# Patient Record
Sex: Male | Born: 1990 | Race: Black or African American | Hispanic: No | Marital: Married | Smoking: Current some day smoker
Health system: Southern US, Community
[De-identification: ages and names within clinical notes are randomized; demographics above are authoritative.]

## PROBLEM LIST (undated history)

## (undated) HISTORY — PX: TONSILLECTOMY: SUR1361

---

## 2016-05-28 ENCOUNTER — Emergency Department (HOSPITAL_COMMUNITY)
Admission: EM | Admit: 2016-05-28 | Discharge: 2016-05-28 | Disposition: A | Discharge status: Home / Self Care | Payer: Self-pay | Attending: Emergency Medicine | Admitting: Emergency Medicine

## 2016-05-28 ENCOUNTER — Encounter (HOSPITAL_COMMUNITY): Payer: Self-pay | Admitting: Emergency Medicine

## 2016-05-28 DIAGNOSIS — N5089 Other specified disorders of the male genital organs: Secondary | ICD-10-CM | POA: Insufficient documentation

## 2016-05-28 DIAGNOSIS — F17200 Nicotine dependence, unspecified, uncomplicated: Secondary | ICD-10-CM | POA: Insufficient documentation

## 2016-05-28 NOTE — ED Notes (Signed)
Bed: WTR5 Expected date:  Expected time:  Means of arrival:  Comments: 

## 2016-05-28 NOTE — Discharge Instructions (Signed)
You will be called if your results are abnormal.  Follow up with a urologist if your symptoms continue.

## 2016-05-28 NOTE — ED Provider Notes (Signed)
[facility]-EMERGENCY DEPT Provider Note   CSN: [id number] Arrival date & time: 05/28/16  1455     History   Chief Complaint Chief Complaint  Patient presents with  . Blisters    HPI Chad Paul is a 26 y.o. male.  Patient is a 26 year old male with no significant past medical history who presents with a painless lesion to his penis. He states he noticed it about 2 days ago and it has been constant since that time. It's not draining. He denies any penile discharge. No discomfort on urination. No abdominal pain. No nausea or vomiting. No history of STDs.      History reviewed. No pertinent past medical history.  There are no active problems to display for this patient.   Past Surgical History:  Procedure Laterality Date  . TONSILLECTOMY         Home Medications    Prior to Admission medications   Medication Sig Start Date End Date Taking? Authorizing Provider  guaiFENesin (MUCINEX) 600 MG 12 hr tablet Take 600 mg by mouth 2 (two) times daily.   Yes [provider]    Family History No family history on file.  Social History Social History  Substance Use Topics  . Smoking status: Current Some Day Smoker  . Smokeless tobacco: Not on file  . Alcohol use Yes     Comment: social     Allergies   Peanut-containing drug products   Review of Systems Review of Systems  Constitutional: Negative for chills, diaphoresis, fatigue and fever.  HENT: Negative for congestion, rhinorrhea and sneezing.   Eyes: Negative.   Respiratory: Negative for cough, chest tightness and shortness of breath.   Cardiovascular: Negative for chest pain and leg swelling.  Gastrointestinal: Negative for abdominal pain, blood in stool, diarrhea, nausea and vomiting.  Genitourinary: Positive for genital sores. Negative for difficulty urinating, flank pain, frequency and hematuria.  Musculoskeletal: Negative for arthralgias and back pain.  Skin: Negative for rash.  Neurological:  Negative for dizziness, speech difficulty, weakness, numbness and headaches.     Physical Exam Updated Vital Signs BP 121/73 (BP Location: Right Arm)   Pulse 67   Temp 98.1 F (36.7 C) (Oral)   Resp 16   SpO2 95%   Physical Exam  Constitutional: He is oriented to person, place, and time. He appears well-developed and well-nourished.  HENT:  Head: Normocephalic and atraumatic.  Eyes: Pupils are equal, round, and reactive to light.  Neck: Normal range of motion. Neck supple.  Cardiovascular: Normal rate and regular rhythm.   Pulmonary/Chest: Effort normal. No respiratory distress. He has no wheezes. He has no rales. He exhibits no tenderness.  Abdominal: Soft. Bowel sounds are normal. There is no tenderness. There is no rebound and no guarding.  Genitourinary:  Genitourinary Comments: Normal circumcised male genitalia. There is one small papule with a small area of ulceration at the tip of the papule to the shaft of the penis. There is no surrounding erythema. No drainage. No other lesions are noted. No penile discharge is noted. No lymphadenopathy is noted.  Musculoskeletal: Normal range of motion. He exhibits no edema.  Lymphadenopathy:    He has no cervical adenopathy.  Neurological: He is alert and oriented to person, place, and time.  Skin: Skin is warm and dry. No rash noted.  Psychiatric: He has a normal mood and affect.     ED Treatments / Results  Labs (all labs ordered are listed, but only abnormal results are displayed) Labs  Reviewed  RPR  HIV ANTIBODY (ROUTINE TESTING)  HSV 1 ANTIBODY, IGG  HSV 2 ANTIBODY, IGG  GC/CHLAMYDIA PROBE AMP ([place]    EKG  EKG Interpretation None       Radiology No results found.  Procedures Procedures (including critical care time)  Medications Ordered in ED Medications - No data to display   Initial Impression / Assessment and Plan / ED Course  I have reviewed the triage vital signs and the nursing  notes.  Pertinent labs & imaging results that were available during my care of the patient were reviewed by me and considered in my medical decision making (see chart for details).    Patient presents with a small painless ulcerated lesion to his penis. Possibly could be syphilis although does not look classic. Given its painless characteristic, I would doubt that it's a herpetic lesion or cane coronary. It doesn't appear to be a genital wart. I will go ahead and testing for STDs including HIV, syphilis and herpes. Will refer him to urology if his labs are negative and his lesion continues.  Final Clinical Impressions(s) / ED Diagnoses   Final diagnoses:  Genital lesion, male    New Prescriptions New Prescriptions   No medications on file     Rolan BuccoBelfi, Alto Gandolfo, MD 05/28/16 1615

## 2016-05-28 NOTE — ED Triage Notes (Signed)
Pt complaint of redness and blisters on penis noted yesterday.

## 2016-05-29 LAB — HSV 1 ANTIBODY, IGG

## 2016-05-29 LAB — HSV 2 ANTIBODY, IGG: HSV 2 Glycoprotein G Ab, IgG: <0.91 index (ref 0.00–0.90)

## 2016-05-29 LAB — RPR: RPR Ser Ql: NONREACTIVE

## 2016-05-29 LAB — HIV ANTIBODY (ROUTINE TESTING W REFLEX): HIV Screen 4th Generation wRfx: NONREACTIVE

## 2016-05-30 LAB — GC/CHLAMYDIA PROBE AMP (~~LOC~~) NOT AT ARMC
CHLAMYDIA, DNA PROBE: NEGATIVE
NEISSERIA GONORRHEA: NEGATIVE

## 2016-06-02 ENCOUNTER — Encounter (HOSPITAL_COMMUNITY): Payer: Self-pay | Admitting: Emergency Medicine

## 2016-06-02 ENCOUNTER — Emergency Department (HOSPITAL_COMMUNITY)
Admission: EM | Admit: 2016-06-02 | Discharge: 2016-06-02 | Disposition: A | Discharge status: Home / Self Care | Payer: Self-pay | Attending: Emergency Medicine | Admitting: Emergency Medicine

## 2016-06-02 DIAGNOSIS — Z91010 Allergy to peanuts: Secondary | ICD-10-CM | POA: Insufficient documentation

## 2016-06-02 DIAGNOSIS — F17200 Nicotine dependence, unspecified, uncomplicated: Secondary | ICD-10-CM | POA: Insufficient documentation

## 2016-06-02 DIAGNOSIS — Z79899 Other long term (current) drug therapy: Secondary | ICD-10-CM | POA: Insufficient documentation

## 2016-06-02 DIAGNOSIS — L989 Disorder of the skin and subcutaneous tissue, unspecified: Secondary | ICD-10-CM

## 2016-06-02 DIAGNOSIS — K120 Recurrent oral aphthae: Secondary | ICD-10-CM | POA: Insufficient documentation

## 2016-06-02 DIAGNOSIS — Z720 Tobacco use: Secondary | ICD-10-CM

## 2016-06-02 MED ORDER — MAGIC MOUTHWASH W/LIDOCAINE: 15.0000 mL | Freq: Three times a day (TID) | ORAL | 0 refills | Status: DC | PRN

## 2016-06-02 MED ORDER — MAGIC MOUTHWASH W/LIDOCAINE
15.0000 mL | Freq: Once | ORAL | Status: AC
  Administered 2016-06-02: 15 mL via ORAL
  Filled 2016-06-02: qty 15

## 2016-06-02 NOTE — ED Triage Notes (Signed)
Patient here with complaints of oral lesions. Reports having the same lesion on genitals 1 week ago but tested negative. Pain 10/10. Unable to eat.

## 2016-06-02 NOTE — Discharge Instructions (Signed)
Use magic mouthwash as directed (swish and swallow) to help with oral lesions and pain. STOP SMOKING! Try to minimize stress. Stick to a bland diet and avoid spicy or acidic foods. Alternate between tylenol and motrin as needed for additional pain relief. Follow up with the [facility] in 1-2 weeks for recheck of symptoms and to establish medical care, and they will assess whether they need to send you to a specialist. Return to the ER for emergent changes or worsening symptoms.

## 2016-06-02 NOTE — ED Provider Notes (Signed)
[facility]-EMERGENCY DEPT Provider Note    By signing my name below, I, Earmon Phoenix, attest that this documentation has been prepared under the direction and in the presence of [address], [employer]. Electronically Signed: Earmon Phoenix, ED Scribe. 06/02/16. 1:23 PM.    History   Chief Complaint Chief Complaint  Patient presents with  . Mouth Lesions   The history is provided by the patient and medical records. No language interpreter was used.  Mouth Lesions  Location:  Buccal mucosa and tongue Quality:  Painful and ulcerous Pain details:    Quality:  Throbbing   Severity:  Moderate   Duration:  1 week   Timing:  Intermittent   Progression:  Worsening Onset quality:  Sudden Severity:  Moderate Duration:  1 week Progression:  Worsening Chronicity:  New Context: stress   Context: not a change in diet, not a change in medications, not medications, not a possible infection and not trauma   Relieved by:  Topical medications and topical solutions Worsened by:  Drinking and eating Associated symptoms: rash   Associated symptoms: no congestion, no ear pain, no fever, no rhinorrhea and no sore throat      Chad Paul is a 26 y.o. Otherwise healthy male who presents to the Emergency Department complaining of worsening, stinging and throbbing blisters/ulcers in the mouth that appeared about one week ago. He reports associated painless blisters to the penis, neck and chest that start out as blisters, then open up and scab over; denies drainage/redness/warmth/crusting. Pt was seen here five days ago for a similar lesion on the penis and was tested for GC/chlamydia, HSV 1 & 2, RPR, and HIV all with negative results. He states that since then, the lesions in his mouth have worsened and more have developed, so he came back for reassessment. He describes the mouth pain as 9/10 at worst, intermittent, stinging/throbbing nonradiating pain around the oral lesions, worse with  eating/drinking, and mildly improved with oragel and mouth wash. He also mentions that he has a mild nonproductive cough that began 1-2 days ago but didn't think it was anything related. He reports some oral swelling due to the lesions, which is intermittent and only seeming to be when the pain occurs. He reports an excessive amount of stress lately. His girlfriend denies any similar symptoms. He denies sick contacts with anyone else with similar symptoms. He denies any new foods, medications, lotions, creams, detergents or other personal hygeine products. He denies drainage from the lesions, fever, chills, drooling, trismus, difficulty swallowing, ear pain/drainage, rhinorrhea, SOB, CP, abdominal pain, constipation, diarrhea, nausea, vomiting, dysuria, hematuria, body aches, numbness, tingling, focal weakness, or any other complaints at this time. He is a smoker. He does not have a PCP.    History reviewed. No pertinent past medical history.  There are no active problems to display for this patient.   Past Surgical History:  Procedure Laterality Date  . TONSILLECTOMY         Home Medications    Prior to Admission medications   Medication Sig Start Date End Date Taking? Authorizing Provider  guaiFENesin (MUCINEX) 600 MG 12 hr tablet Take 600 mg by mouth 2 (two) times daily.    [provider]    Family History No family history on file.  Social History Social History  Substance Use Topics  . Smoking status: Current Some Day Smoker  . Smokeless tobacco: Never Used  . Alcohol use Yes     Comment: social  Allergies   Peanut-containing drug products   Review of Systems Review of Systems  Constitutional: Negative for chills and fever.  HENT: Positive for mouth sores. Negative for congestion, drooling, ear discharge, ear pain, rhinorrhea, sore throat and trouble swallowing.   Respiratory: Positive for cough (mild, dry). Negative for shortness of breath.     Cardiovascular: Negative for chest pain.  Gastrointestinal: Negative for abdominal pain, constipation, diarrhea, nausea and vomiting.  Genitourinary: Positive for genital sores. Negative for dysuria and hematuria.  Musculoskeletal: Negative for arthralgias and myalgias.  Skin: Positive for rash.  Allergic/Immunologic: Negative for immunocompromised state.  Neurological: Negative for weakness and numbness.  Psychiatric/Behavioral: Negative for confusion.   All other systems reviewed and are negative for acute change except as noted in the HPI.   Physical Exam Updated Vital Signs BP 114/86 (BP Location: Left Arm)   Pulse (!) 101   Temp 98.5 F (36.9 C) (Oral)   Resp 18   SpO2 99%   Physical Exam  Constitutional: He is oriented to person, place, and time. Vital signs are normal. He appears well-developed and well-nourished.  Non-toxic appearance. No distress.  Afebrile, nontoxic, NAD  HENT:  Head: Normocephalic and atraumatic.  Nose: Nose normal.  Mouth/Throat: Uvula is midline, oropharynx is clear and moist and mucous membranes are normal. Oral lesions present. No trismus in the jaw. No uvula swelling. Tonsils are 0 on the right. Tonsils are 0 on the left. No tonsillar exudate.  Small scabbed over blister to upper lip, no drainage or swelling, no crusting, no erythema/warmth. Multiple aphthous ulcerations in buccal mucosal surfaces and underneath tongue, with small halo of erythema with central clearing, no drainage/swelling, handling secretions well. No plaques.  Posterior oropharynx clear and moist, without uvular swelling or deviation, no trismus or drooling, no tonsillar swelling or erythema, no exudates.  Nose clear.   Eyes: Conjunctivae and EOM are normal. Right eye exhibits no discharge. Left eye exhibits no discharge.  Neck: Normal range of motion. Neck supple.  Cardiovascular: Normal rate and intact distal pulses.   Pulmonary/Chest: Effort normal. No respiratory distress.   Abdominal: Normal appearance. He exhibits no distension.  Musculoskeletal: Normal range of motion.  Neurological: He is alert and oriented to person, place, and time. He has normal strength. No sensory deficit.  Skin: Skin is warm, dry and intact. Lesion noted. No rash noted.  Small shallow/superficial ?ulceration to anterior L chest that looks like a scrape where the scab has come off, no raised border, no crusting/drainage, no swelling, no erythema/warmth. Nontender.   Psychiatric: He has a normal mood and affect.  Nursing note and vitals reviewed.    ED Treatments / Results  DIAGNOSTIC STUDIES: Oxygen Saturation is 99% on RA, normal by my interpretation.   COORDINATION OF CARE: 1:14 PM- Will treat symptomatically. Will give information for patient to establish care at [facility]. Pt verbalizes understanding and agrees to plan.  Medications  magic mouthwash w/lidocaine (not administered)    Labs (all labs ordered are listed, but only abnormal results are displayed) Labs Reviewed - No data to display   Results for orders placed or performed during the hospital encounter of 05/28/16  RPR  Result Value Ref Range   RPR Ser Ql Non Reactive Non Reactive  HIV antibody  Result Value Ref Range   HIV Screen 4th Generation wRfx Non Reactive Non Reactive  HSV 1 antibody, IgG  Result Value Ref Range   HSV 1 Glycoprotein G Ab, IgG <0.91 0.00 - 0.90 index  HSV 2 antibody, IgG  Result Value Ref Range   HSV 2 Glycoprotein G Ab, IgG <0.91 0.00 - 0.90 index  GC/Chlamydia probe amp  Result Value Ref Range   Chlamydia Negative    Neisseria gonorrhea Negative     EKG  EKG Interpretation None       Radiology No results found.  Procedures Procedures (including critical care time)  Medications Ordered in ED Medications  magic mouthwash w/lidocaine (not administered)     Initial Impression / Assessment and Plan / ED Course  I have reviewed the triage vital signs and the nursing  notes.  Pertinent labs & imaging results that were available during my care of the patient were reviewed by me and considered in my medical decision making (see chart for details).     26 y.o. male here with oral ulcerations and ongoing skin ulcerations. Was seen here 5 days ago for painless genital sore, was tested for HIV/RPR/GC/CT/HSV1+2 which were all negative. Now having worsening oral lesions. Admits to smoking cigarettes, cessation advised; admits to a lot of stress recently as well, no new foods/exposures, and no sick contacts. On exam, oral lesions appear to be aphthous ulcers, multiple of them on the buccal mucosal surfaces and underside of tongue, no swelling or drainage appreciated; handling secretions well. Small scabbed lesions to upper lip. Anterior chest with shallow ulceration without surrounding swelling or crusting, no erythema or warmth, looks like scrape that the scab came off of. Very odd symptoms, difficult etiology to narrow down; could be IBD vs viral illness vs stress related vs food related. Hard to say where he should go for further testing: derm vs ENT vs GI vs ID? No focal area of issue to guide our follow up. Since he's been tested for all the logical etiologies, and all were negative, will proceed with magic mouthwash, advised stress reduction, bland diet, avoidance of spicy/acidic foods, and OTC remedies for symptomatic relief, and f/up with [facility] in 1-2wks to establish care and recheck. Hopefully something will start to declare itself and help guide the referral, if needed. For now, will hold off on referral to any specialist. I explained the diagnosis and have given explicit precautions to return to the ER including for any other new or worsening symptoms. The patient understands and accepts the medical plan as it's been dictated and I have answered their questions. Discharge instructions concerning home care and prescriptions have been given. The patient is STABLE and is  discharged to home in good condition.   I personally performed the services described in this documentation, which was scribed in my presence. The recorded information has been reviewed and is accurate.   Final Clinical Impressions(s) / ED Diagnoses   Final diagnoses:  Aphthous ulcer of mouth  Skin lesions, generalized  Tobacco user    New Prescriptions New Prescriptions   MAGIC MOUTHWASH W/LIDOCAINE SOLN    Take 15 mLs by mouth 3 (three) times daily as needed for mouth pain. Dispense in a 1/1/1/1 ratio. Use lidocaine, diphenhydramine, alum & mag hydroxide-simeth, and nystatin     [address], [place], [place] 06/02/16 1339    Charlynne PanderYao, David Hsienta, MD 06/04/16 [phone]

## 2017-01-02 ENCOUNTER — Encounter (HOSPITAL_COMMUNITY): Payer: Self-pay | Admitting: Emergency Medicine

## 2017-01-02 ENCOUNTER — Other Ambulatory Visit: Payer: Self-pay

## 2017-01-02 ENCOUNTER — Emergency Department (HOSPITAL_COMMUNITY)
Admission: EM | Admit: 2017-01-02 | Discharge: 2017-01-02 | Disposition: A | Discharge status: Home / Self Care | Payer: Self-pay | Attending: Emergency Medicine | Admitting: Emergency Medicine

## 2017-01-02 ENCOUNTER — Emergency Department (HOSPITAL_COMMUNITY): Payer: Self-pay

## 2017-01-02 DIAGNOSIS — X503XXA Overexertion from repetitive movements, initial encounter: Secondary | ICD-10-CM | POA: Insufficient documentation

## 2017-01-02 DIAGNOSIS — Y990 Civilian activity done for income or pay: Secondary | ICD-10-CM | POA: Insufficient documentation

## 2017-01-02 DIAGNOSIS — M62838 Other muscle spasm: Secondary | ICD-10-CM | POA: Insufficient documentation

## 2017-01-02 DIAGNOSIS — S46912A Strain of unspecified muscle, fascia and tendon at shoulder and upper arm level, left arm, initial encounter: Secondary | ICD-10-CM | POA: Insufficient documentation

## 2017-01-02 DIAGNOSIS — Y9389 Activity, other specified: Secondary | ICD-10-CM | POA: Insufficient documentation

## 2017-01-02 DIAGNOSIS — Y9289 Other specified places as the place of occurrence of the external cause: Secondary | ICD-10-CM | POA: Insufficient documentation

## 2017-01-02 DIAGNOSIS — F17210 Nicotine dependence, cigarettes, uncomplicated: Secondary | ICD-10-CM | POA: Insufficient documentation

## 2017-01-02 MED ORDER — DIAZEPAM 5 MG PO TABS
5.0000 mg | ORAL_TABLET | Freq: Once | ORAL | Status: AC
  Administered 2017-01-02: 5 mg via ORAL
  Filled 2017-01-02: qty 1

## 2017-01-02 MED ORDER — CYCLOBENZAPRINE HCL 10 MG PO TABS: 10.0000 mg | ORAL_TABLET | Freq: Two times a day (BID) | ORAL | 0 refills | Status: DC | PRN

## 2017-01-02 MED ORDER — KETOROLAC TROMETHAMINE 60 MG/2ML IM SOLN
30.0000 mg | Freq: Once | INTRAMUSCULAR | Status: AC
  Administered 2017-01-02: 30 mg via INTRAMUSCULAR
  Filled 2017-01-02: qty 2

## 2017-01-02 MED ORDER — DICLOFENAC SODIUM 50 MG PO TBEC: 50.0000 mg | DELAYED_RELEASE_TABLET | Freq: Two times a day (BID) | ORAL | 0 refills | Status: DC

## 2017-01-02 NOTE — Discharge Instructions (Signed)
Do not take the muscle relaxer while driving or at work as it can make you sleepy.

## 2017-01-02 NOTE — ED Provider Notes (Signed)
[facility]-EMERGENCY DEPT Provider Note   CSN: [id number] Arrival date & time: 01/02/17  1139     History   Chief Complaint Chief Complaint  Patient presents with  . Shoulder Pain    HPI Chad Paul is a 26 y.o. male who presents to the ED with left shoulder pain that started 3 days ago and has gotten worse. 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 The pain increases with movement.   The history is provided by the patient.  Shoulder Pain   This is a new problem. The current episode started more than 2 days ago. The problem occurs constantly. The problem has been gradually worsening. The pain is present in the left shoulder. The pain is moderate. There has been no history of extremity trauma.    History reviewed. No pertinent past medical history.  There are no active problems to display for this patient.   Past Surgical History:  Procedure Laterality Date  . TONSILLECTOMY         Home Medications    Prior to Admission medications   Medication Sig Start Date End Date Taking? Authorizing Provider  cyclobenzaprine (FLEXERIL) 10 MG tablet Take 1 tablet (10 mg total) by mouth 2 (two) times daily as needed for muscle spasms. 01/02/17   Janne NapoleonNeese, Nekhi Liwanag M, NP  diclofenac (VOLTAREN) 50 MG EC tablet Take 1 tablet (50 mg total) by mouth 2 (two) times daily. 01/02/17   Janne NapoleonNeese, Yoshino Broccoli M, NP  guaiFENesin (MUCINEX) 600 MG 12 hr tablet Take 600 mg by mouth 2 (two) times daily.    [provider]  magic mouthwash w/lidocaine SOLN Take 15 mLs by mouth 3 (three) times daily as needed for mouth pain. Dispense in a 1/1/1/1 ratio. Use lidocaine, diphenhydramine, alum & mag hydroxide-simeth, and nystatin 06/02/16   Street, [place], [place]    Family History No family history on file.  Social History Social History   Tobacco Use  . Smoking status: Current Some Day Smoker  . Smokeless  tobacco: Never Used  Substance Use Topics  . Alcohol use: Yes    Comment: social  . Drug use: No     Allergies   Peanut-containing drug products   Review of Systems Review of Systems  Musculoskeletal: Positive for arthralgias.       Left shoulder pain  All other systems reviewed and are negative.    Physical Exam Updated Vital Signs BP 121/67 (BP Location: Left Arm)   Pulse 60   Temp 97.6 F (36.4 C) (Oral)   Resp 20   Ht 5\' 10"  (1.778 m)   Wt 70.3 kg (155 lb)   SpO2 100%   BMI 22.24 kg/m   Physical Exam  Constitutional: He appears well-developed and well-nourished. No distress.  HENT:  Head: Normocephalic and atraumatic.  Eyes: EOM are normal.  Neck: Normal range of motion. Neck supple.  Cardiovascular: Normal rate.  Pulmonary/Chest: Effort normal.  Musculoskeletal:       Left shoulder: He exhibits tenderness, pain and spasm. He exhibits no swelling, no effusion, no crepitus, no deformity, normal pulse and normal strength. Decreased range of motion: due to pain.  Radial pulses 2+, adequate circulation. Grips are equal. There is spasm noted to the posterior aspect of the left shoulder. Patient can lift his left arm and put his arm to the front but has difficulty with putting arms behind back due to pain.   Neurological:  He is alert.  Skin: Skin is warm and dry.  Psychiatric: He has a normal mood and affect. His behavior is normal.  Nursing note and vitals reviewed.    ED Treatments / Results  Labs (all labs ordered are listed, but only abnormal results are displayed) Labs Reviewed - No data to display Radiology Dg Shoulder Left  Result Date: 01/02/2017 CLINICAL DATA:  Anterior right shoulder pain and pain about the tip of the scapula for 2 days. No known injury. Initial encounter. EXAM: LEFT SHOULDER - 2+ VIEW COMPARISON:  None. FINDINGS: There is no evidence of fracture or dislocation. There is no evidence of arthropathy or other focal bone abnormality.  Soft tissues are unremarkable. IMPRESSION: Normal exam. Electronically Signed   By: Drusilla Kannerhomas  Dalessio M.D.   On: 01/02/2017 11:59    Procedures Procedures (including critical care time)  Medications Ordered in ED Medications  diazepam (VALIUM) tablet 5 mg (5 mg Oral Given 01/02/17 1525)  ketorolac (TORADOL) injection 30 mg (30 mg Intramuscular Given 01/02/17 1525)     Initial Impression / Assessment and Plan / ED Course  I have reviewed the triage vital signs and the nursing notes. 26 y.o. male with left shoulder pain stable for d/c without fracture or dislocation noted on x-ray. Patient treated for muscle strain and spasm and had significant pain relief while in the ED. Will d/c home with Rx for muscle relaxer and NSAIDS. Discussed f/u with patient and return precautions. Discussed that he should not work or drive while taking the muscle relaxer.   Final Clinical Impressions(s) / ED Diagnoses   Final diagnoses:  Left shoulder strain, initial encounter  Muscle spasm    ED Discharge Orders        Ordered    cyclobenzaprine (FLEXERIL) 10 MG tablet  2 times daily PRN     01/02/17 1602    diclofenac (VOLTAREN) 50 MG EC tablet  2 times daily     01/02/17 1602       Kerrie Buffaloeese, Shantese Raven GoshenM, [place]NP 01/02/17 2318    Donnetta Hutchingook, Brian, MD 01/03/17 1620

## 2017-01-02 NOTE — ED Triage Notes (Signed)
Pt verbalizes worsening left shoulder pain worse with movement over past 2 days; heavy lifting with job.

## 2018-01-02 ENCOUNTER — Encounter (HOSPITAL_COMMUNITY): Payer: Self-pay

## 2018-01-02 ENCOUNTER — Emergency Department (HOSPITAL_COMMUNITY)
Admission: EM | Admit: 2018-01-02 | Discharge: 2018-01-02 | Disposition: A | Discharge status: Home / Self Care | Payer: Self-pay | Attending: Emergency Medicine | Admitting: Emergency Medicine

## 2018-01-02 ENCOUNTER — Emergency Department (HOSPITAL_COMMUNITY): Payer: Self-pay

## 2018-01-02 DIAGNOSIS — B9789 Other viral agents as the cause of diseases classified elsewhere: Secondary | ICD-10-CM

## 2018-01-02 DIAGNOSIS — R69 Illness, unspecified: Secondary | ICD-10-CM

## 2018-01-02 DIAGNOSIS — R112 Nausea with vomiting, unspecified: Secondary | ICD-10-CM | POA: Insufficient documentation

## 2018-01-02 DIAGNOSIS — Z91010 Allergy to peanuts: Secondary | ICD-10-CM | POA: Insufficient documentation

## 2018-01-02 DIAGNOSIS — J111 Influenza due to unidentified influenza virus with other respiratory manifestations: Secondary | ICD-10-CM

## 2018-01-02 DIAGNOSIS — F17200 Nicotine dependence, unspecified, uncomplicated: Secondary | ICD-10-CM | POA: Insufficient documentation

## 2018-01-02 DIAGNOSIS — J069 Acute upper respiratory infection, unspecified: Secondary | ICD-10-CM | POA: Insufficient documentation

## 2018-01-02 LAB — CBC WITH DIFFERENTIAL/PLATELET
Abs Immature Granulocytes: 0.01 10*3/uL (ref 0.00–0.07)
Basophils Absolute: 0 10*3/uL (ref 0.0–0.1)
Basophils Relative: 1 %
Eosinophils Absolute: 0.2 10*3/uL (ref 0.0–0.5)
Eosinophils Relative: 3 %
HCT: 43 % (ref 39.0–52.0)
Hemoglobin: 14.2 g/dL (ref 13.0–17.0)
Immature Granulocytes: 0 %
Lymphocytes Relative: 25 %
Lymphs Abs: 1.6 10*3/uL (ref 0.7–4.0)
MCH: 31.3 pg (ref 26.0–34.0)
MCHC: 33 g/dL (ref 30.0–36.0)
MCV: 94.7 fL (ref 80.0–100.0)
Monocytes Absolute: 0.3 10*3/uL (ref 0.1–1.0)
Monocytes Relative: 5 %
Neutro Abs: 4.1 10*3/uL (ref 1.7–7.7)
Neutrophils Relative %: 66 %
Platelets: 148 10*3/uL — ABNORMAL LOW (ref 150–400)
RBC: 4.54 MIL/uL (ref 4.22–5.81)
RDW: 13.4 % (ref 11.5–15.5)
WBC: 6.2 10*3/uL (ref 4.0–10.5)
nRBC: 0 % (ref 0.0–0.2)

## 2018-01-02 LAB — BASIC METABOLIC PANEL
Anion gap: 6 (ref 5–15)
BUN: 20 mg/dL (ref 6–20)
CO2: 24 mmol/L (ref 22–32)
Calcium: 8.9 mg/dL (ref 8.9–10.3)
Chloride: 110 mmol/L (ref 98–111)
Creatinine, Ser: 1.05 mg/dL (ref 0.61–1.24)
GFR calc Af Amer: >60 mL/min (ref >60)
GFR calc non Af Amer: >60 mL/min (ref >60)
Glucose, Bld: 95 mg/dL (ref 70–99)
Potassium: 3.9 mmol/L (ref 3.5–5.1)
Sodium: 140 mmol/L (ref 135–145)

## 2018-01-02 MED ORDER — GUAIFENESIN ER 1200 MG PO TB12: 1.0000 | ORAL_TABLET | Freq: Two times a day (BID) | ORAL | 0 refills | Status: DC

## 2018-01-02 MED ORDER — IBUPROFEN 800 MG PO TABS: 800.0000 mg | ORAL_TABLET | Freq: Three times a day (TID) | ORAL | 0 refills | Status: DC | PRN

## 2018-01-02 MED ORDER — SODIUM CHLORIDE 0.9 % IV BOLUS
1000.0000 mL | Freq: Once | INTRAVENOUS | Status: AC
  Administered 2018-01-02: 1000 mL via INTRAVENOUS

## 2018-01-02 MED ORDER — ONDANSETRON HCL 4 MG/2ML IJ SOLN
4.0000 mg | Freq: Once | INTRAMUSCULAR | Status: DC
  Filled 2018-01-02: qty 2

## 2018-01-02 MED ORDER — PROMETHAZINE-DM 6.25-15 MG/5ML PO SYRP: 10.0000 mL | ORAL_SOLUTION | Freq: Four times a day (QID) | ORAL | 0 refills | Status: DC | PRN

## 2018-01-02 NOTE — ED Provider Notes (Signed)
[facility]-EMERGENCY DEPT Provider Note   CSN: [id number] Arrival date & time: 01/02/18  0543     History   Chief Complaint Chief Complaint  Patient presents with  . URI  . Emesis    HPI Chad KirschnerCarlos Paul is a 27 y.o. male.  HPI Patient presents to the emergency department with cough nasal congestion body aches and vomiting since early this morning.  The patient states the body aches are worse when he coughs.  Patient has also had chills as well.  Patient states he woke up sweating in the middle the night.  Patient states that he also has some sore throat and coughing.  The patient states he did not take any medications prior to arrival for his symptoms.  Patient states that he has had no other recent illnesses.  Patient states he is not taking any medication for any chronic conditions.  The patient denies chest pain, shortness of breath, headache,blurred vision, neck pain, fever, cough, weakness, numbness, dizziness, anorexia, edema, abdominal pain, , diarrhea, rash, back pain, dysuria, hematemesis, bloody stool, near syncope, or syncope. History reviewed. No pertinent past medical history.  There are no active problems to display for this patient.   Past Surgical History:  Procedure Laterality Date  . TONSILLECTOMY          Home Medications    Prior to Admission medications   Not on File    Family History History reviewed. No pertinent family history.  Social History Social History   Tobacco Use  . Smoking status: Current Some Day Smoker  . Smokeless tobacco: Never Used  Substance Use Topics  . Alcohol use: Yes    Comment: social  . Drug use: No     Allergies   Peanut-containing drug products and Soy allergy   Review of Systems Review of Systems  All other systems negative except as documented in the HPI. All pertinent positives and negatives as reviewed in the HPI. Physical Exam Updated Vital Signs BP 120/70   Pulse 69    Temp 98.3 F (36.8 C) (Oral)   Resp 16   SpO2 100%   Physical Exam Vitals signs and nursing note reviewed.  Constitutional:      General: He is not in acute distress.    Appearance: He is well-developed.  HENT:     Head: Normocephalic and atraumatic.  Eyes:     Pupils: Pupils are equal, round, and reactive to light.  Neck:     Musculoskeletal: Normal range of motion and neck supple.  Cardiovascular:     Rate and Rhythm: Normal rate and regular rhythm.     Heart sounds: Normal heart sounds. No murmur. No friction rub. No gallop.   Pulmonary:     Effort: Pulmonary effort is normal. No respiratory distress.     Breath sounds: Normal breath sounds. No wheezing.  Abdominal:     General: Bowel sounds are normal. There is no distension.     Palpations: Abdomen is soft. There is no mass.     Tenderness: There is no abdominal tenderness. There is no guarding or rebound.     Hernia: No hernia is present.  Skin:    General: Skin is warm and dry.     Capillary Refill: Capillary refill takes less than 2 seconds.     Findings: No erythema or rash.  Neurological:     Mental Status: He is alert and oriented to person, place, and time.     Motor: No  abnormal muscle tone.     Coordination: Coordination normal.  Psychiatric:        Behavior: Behavior normal.      ED Treatments / Results  Labs (all labs ordered are listed, but only abnormal results are displayed) Labs Reviewed  CBC WITH DIFFERENTIAL/PLATELET - Abnormal; Notable for the following components:      Result Value   Platelets 148 (*)    All other components within normal limits  BASIC METABOLIC PANEL    EKG None  Radiology Dg Chest 2 View  Result Date: 01/02/2018 CLINICAL DATA:  New onset cough. EXAM: CHEST - 2 VIEW COMPARISON:  No prior. FINDINGS: Mediastinum hilar structures normal. Lungs are clear. No pleural effusion or pneumothorax. Heart size normal. No acute bony abnormality identified IMPRESSION: No acute  cardiopulmonary disease. Electronically Signed   By: Maisie Fus  Register   On: 01/02/2018 07:29    Procedures Procedures (including critical care time)  Medications Ordered in ED Medications  ondansetron [facility]) injection 4 mg (4 mg Intravenous Refused 01/02/18 0845)  sodium chloride 0.9 % bolus 1,000 mL (0 mLs Intravenous Stopped 01/02/18 0845)     Initial Impression / Assessment and Plan / ED Course  I have reviewed the triage vital signs and the nursing notes.  Pertinent labs & imaging results that were available during my care of the patient were reviewed by me and considered in my medical decision making (see chart for details).     The patient most likely has an influenza-like illness that is causing his symptoms.  The patient is advised to increase his fluid intake and rest as much as possible.  Patient was given IV fluids here in the emergency department he is tolerating oral fluids.  The patient will be advised to for any worsening in his condition.  The patient agrees to the plan and all questions were answered. Final Clinical Impressions(s) / ED Diagnoses   Final diagnoses:  None    ED Discharge Orders    None       Charlestine Night, PA-C 01/02/18 1610    Jacalyn Lefevre, MD 01/02/18 [phone]

## 2018-01-02 NOTE — Discharge Instructions (Signed)
Return here as needed.  Follow-up with primary doctor as needed.  Increase your fluid intake and rest as much as possible.  Your symptoms are most likely related to influenza-like illness.  Your testing here today did not show any significant abnormalities your chest x-ray was also normal.

## 2018-01-02 NOTE — ED Notes (Signed)
Pt complains of a cough, congestion and vomiting since this morning He states that he has general body aches especially when he coughs

## 2018-01-02 NOTE — ED Notes (Signed)
Patient very rude yelling " what kind of a fucking nurse are" you regarding of trying to take his iv out. I left the room and have another tech to take IV out.

## 2019-01-23 ENCOUNTER — Other Ambulatory Visit: Payer: Self-pay

## 2019-01-23 DIAGNOSIS — Z20822 Contact with and (suspected) exposure to covid-19: Secondary | ICD-10-CM

## 2019-01-25 LAB — NOVEL CORONAVIRUS, NAA: SARS-CoV-2, NAA: NOT DETECTED

## 2019-04-21 IMAGING — CR DG SHOULDER 2+V*L*
3 series · 3 of 3 positions shown · non-contrast
Comparison: None.

CLINICAL DATA: Anterior right shoulder pain and pain about the tip
of the scapula for 2 days. No known injury. Initial encounter.

EXAM:
LEFT SHOULDER - 2+ VIEW

[w shoulder external left]
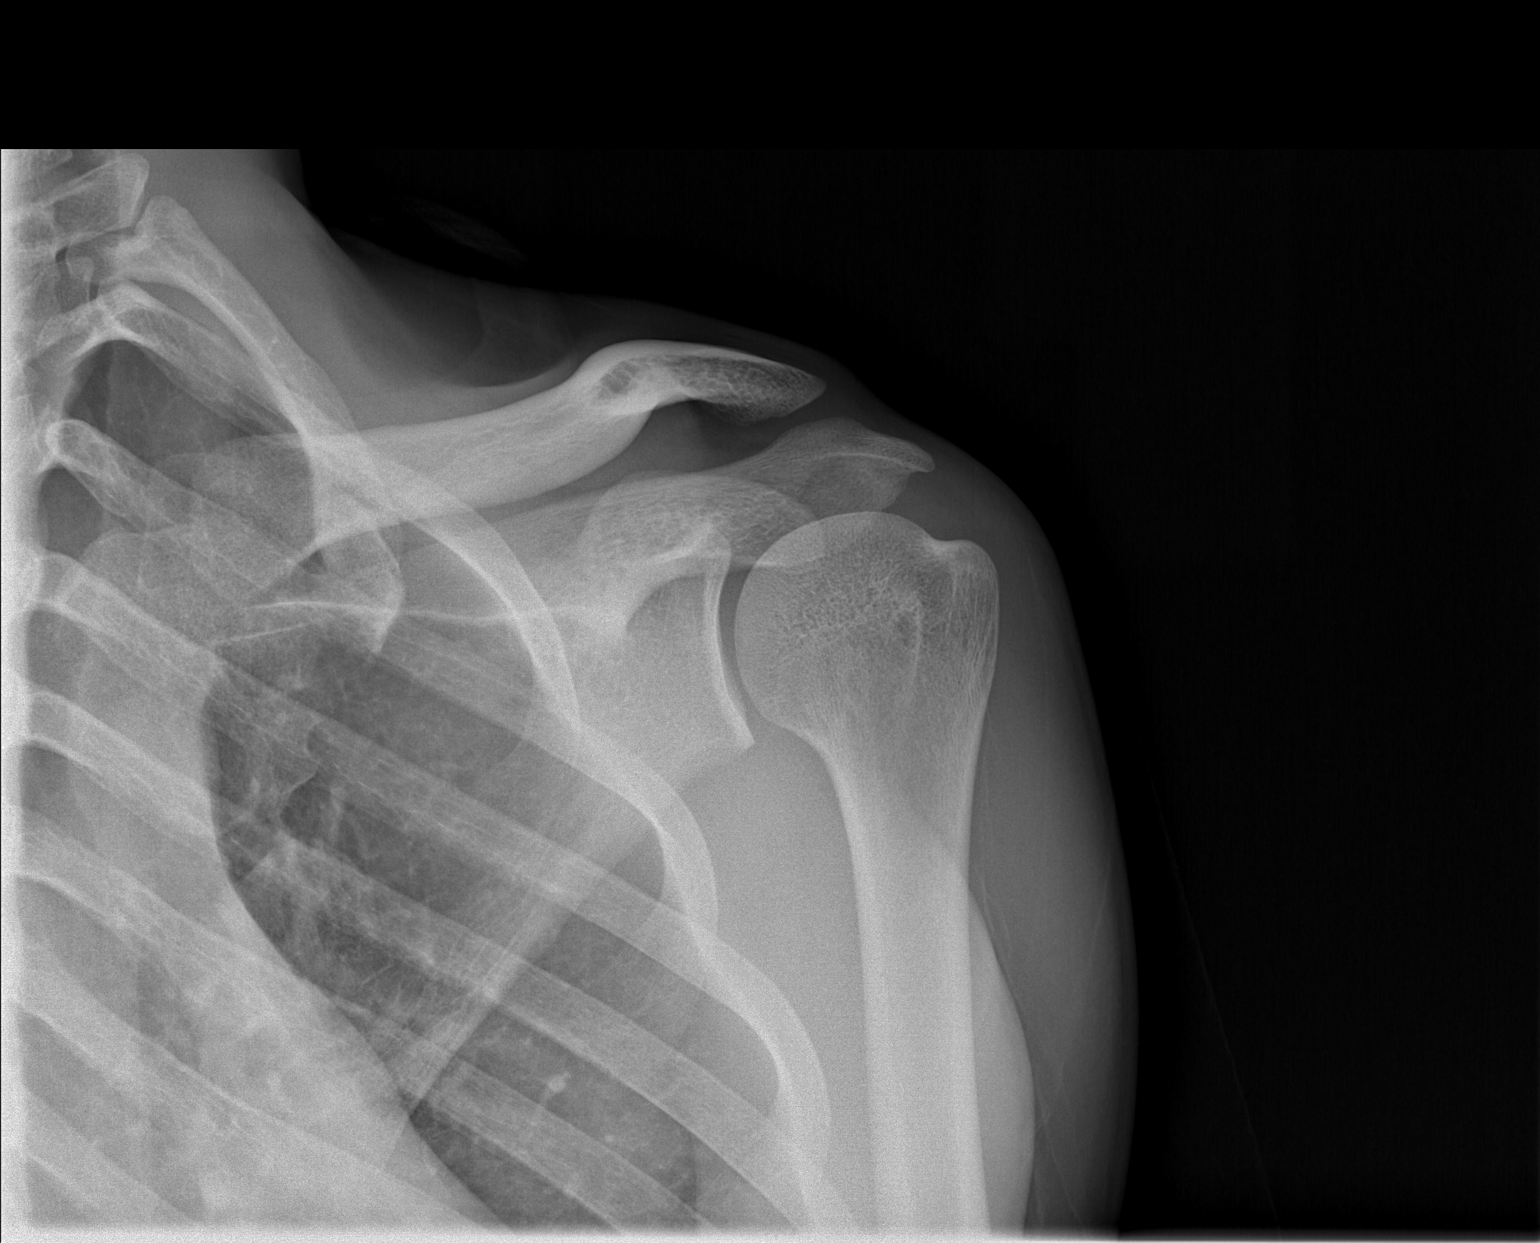

[w shoulder y-view left]
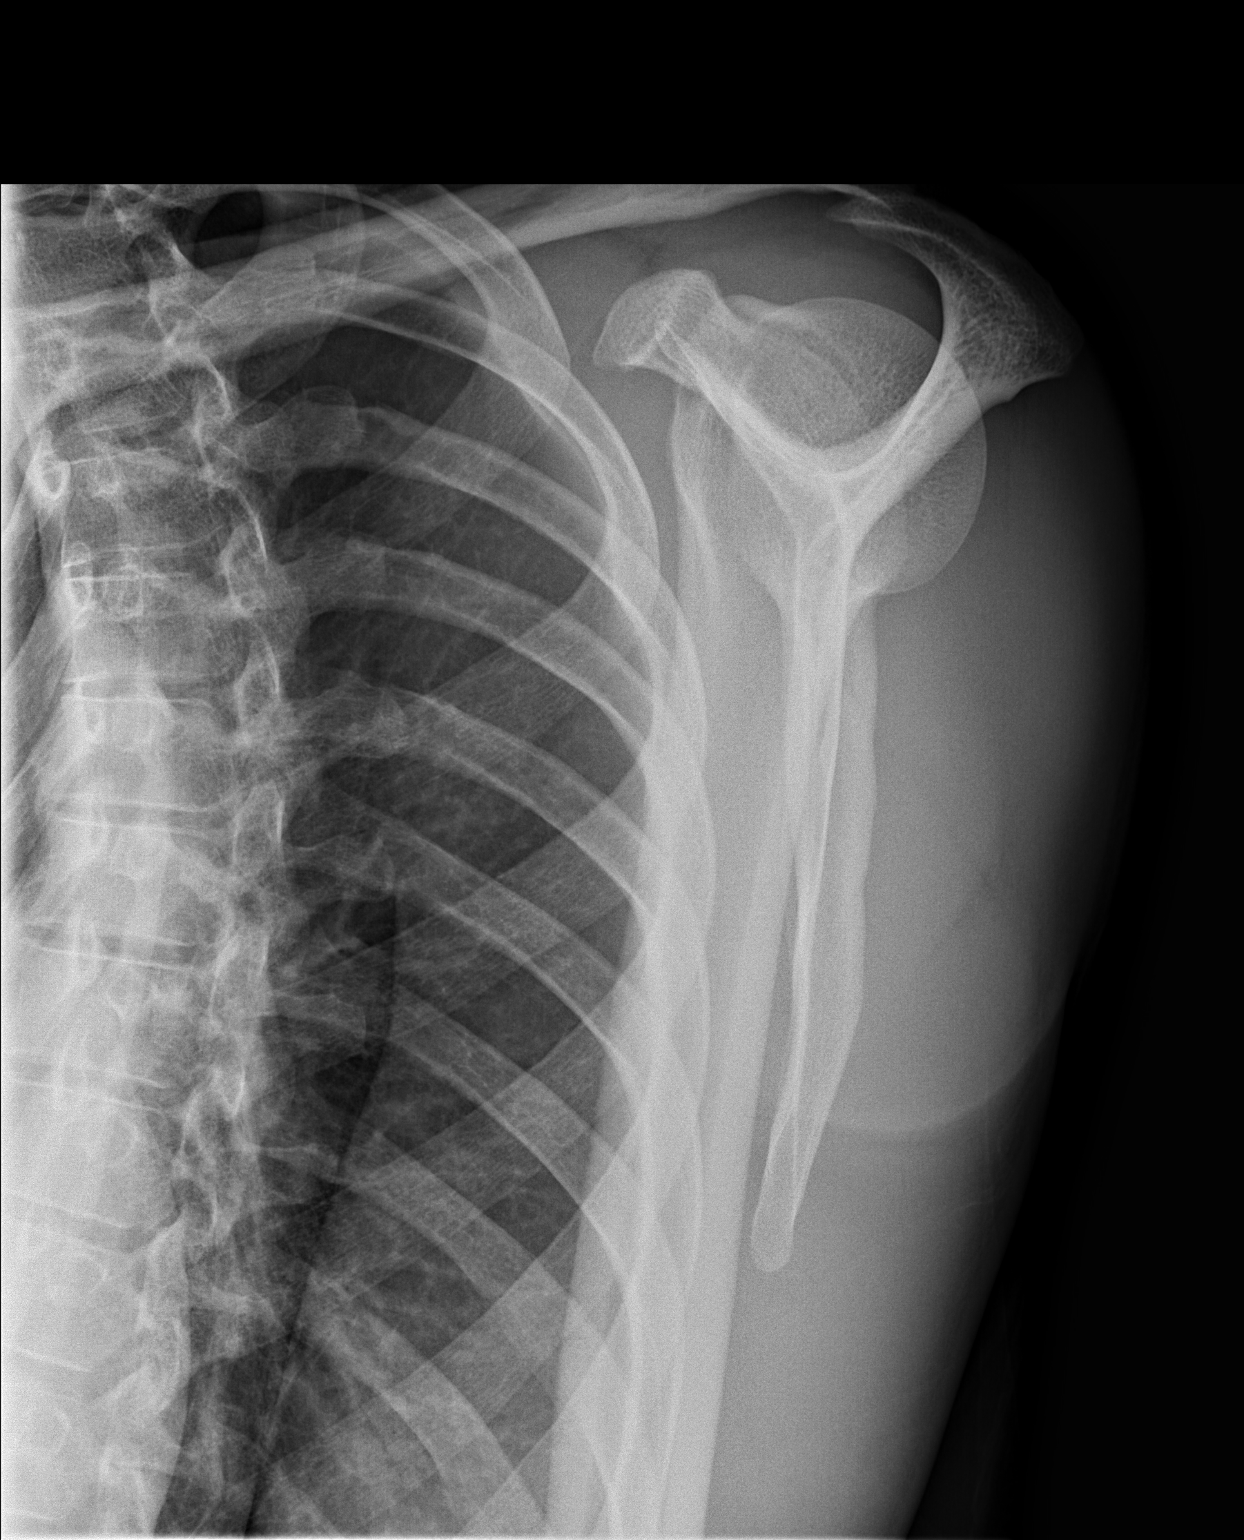

[x shoulder axillary left]
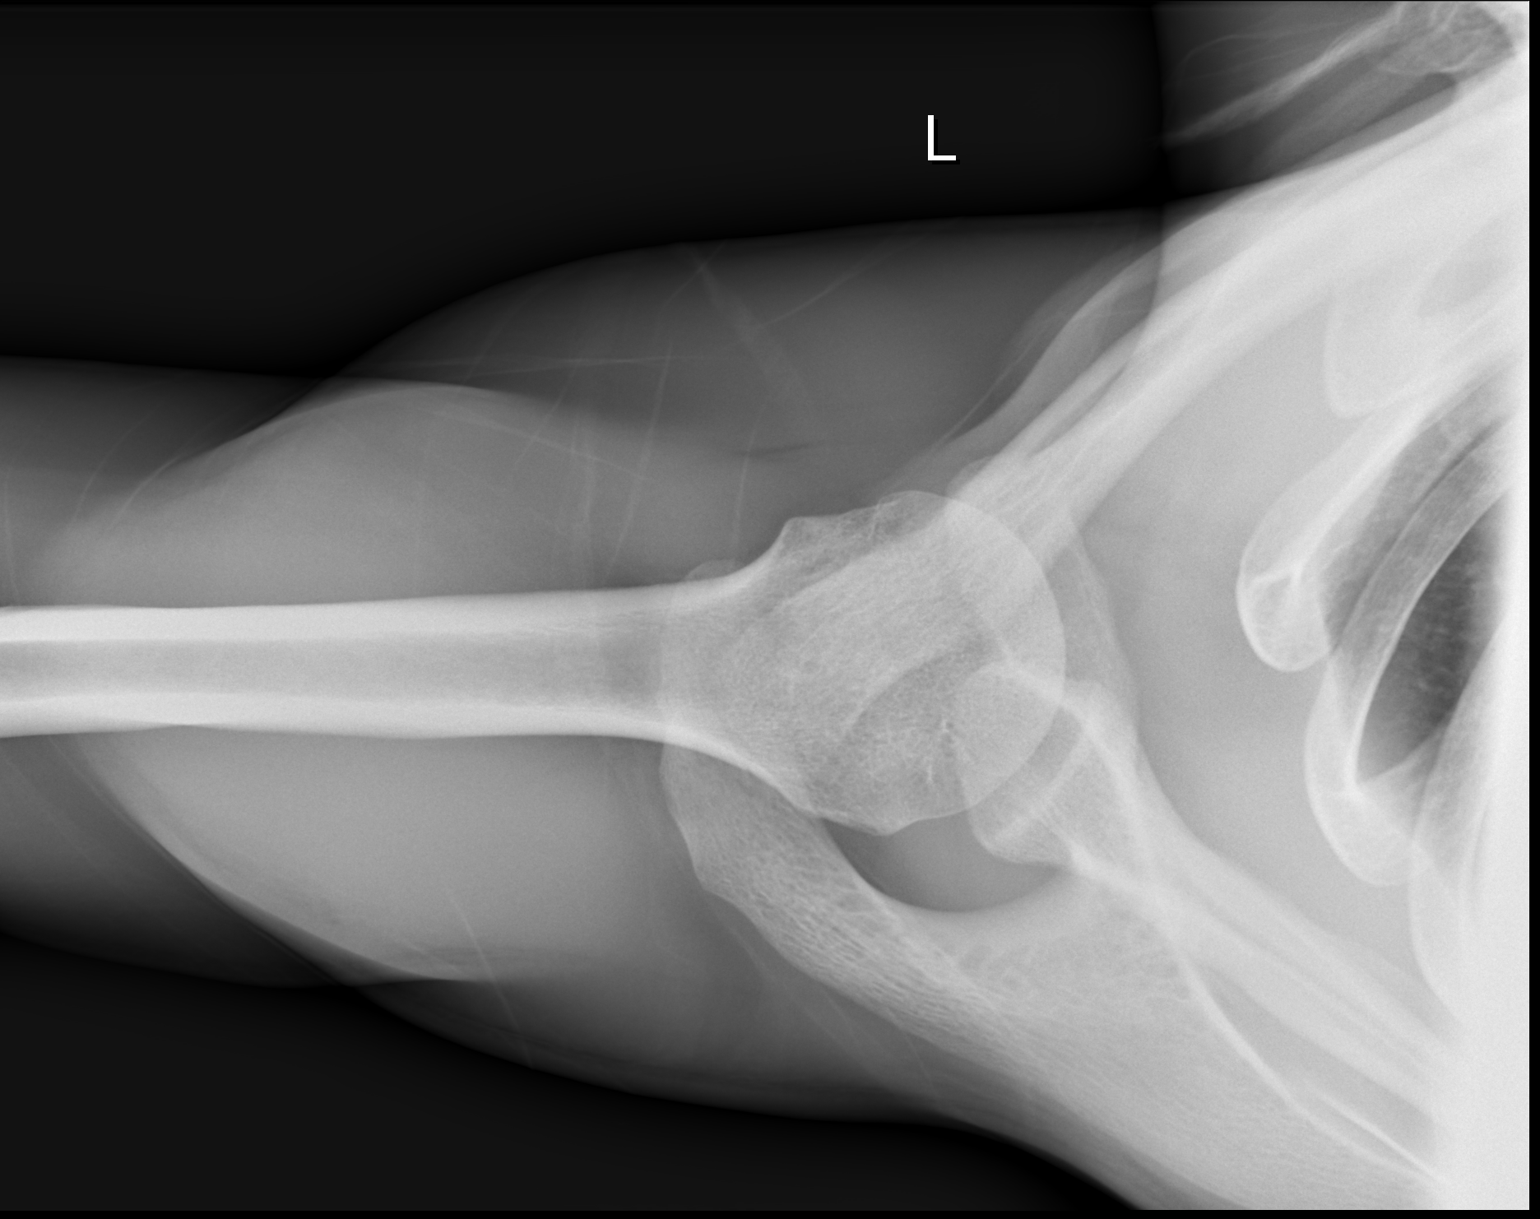

[3 of 3 positions shown; findings below may reference images not displayed]

FINDINGS: There is no evidence of fracture or dislocation. There is no
evidence of arthropathy or other focal bone abnormality. Soft
tissues are unremarkable.
IMPRESSION: Normal exam.

## 2019-05-30 ENCOUNTER — Other Ambulatory Visit: Payer: Self-pay

## 2019-05-30 ENCOUNTER — Emergency Department (HOSPITAL_COMMUNITY)
Admission: EM | Admit: 2019-05-30 | Discharge: 2019-05-30 | Disposition: A | Discharge status: Home / Self Care | Payer: Self-pay | Attending: Emergency Medicine | Admitting: Emergency Medicine

## 2019-05-30 ENCOUNTER — Emergency Department (HOSPITAL_COMMUNITY): Payer: Self-pay

## 2019-05-30 ENCOUNTER — Encounter (HOSPITAL_COMMUNITY): Payer: Self-pay | Admitting: Emergency Medicine

## 2019-05-30 DIAGNOSIS — Y999 Unspecified external cause status: Secondary | ICD-10-CM | POA: Insufficient documentation

## 2019-05-30 DIAGNOSIS — Y9389 Activity, other specified: Secondary | ICD-10-CM | POA: Insufficient documentation

## 2019-05-30 DIAGNOSIS — F17200 Nicotine dependence, unspecified, uncomplicated: Secondary | ICD-10-CM | POA: Insufficient documentation

## 2019-05-30 DIAGNOSIS — S46912A Strain of unspecified muscle, fascia and tendon at shoulder and upper arm level, left arm, initial encounter: Secondary | ICD-10-CM

## 2019-05-30 DIAGNOSIS — S46812A Strain of other muscles, fascia and tendons at shoulder and upper arm level, left arm, initial encounter: Secondary | ICD-10-CM | POA: Insufficient documentation

## 2019-05-30 DIAGNOSIS — Y929 Unspecified place or not applicable: Secondary | ICD-10-CM | POA: Insufficient documentation

## 2019-05-30 DIAGNOSIS — X500XXA Overexertion from strenuous movement or load, initial encounter: Secondary | ICD-10-CM | POA: Insufficient documentation

## 2019-05-30 NOTE — Discharge Instructions (Signed)
Please read and follow all provided instructions.  Your diagnoses today include:  1. Strain of left shoulder, initial encounter     Tests performed today include:  An x-ray of the affected area - does NOT show any broken bones  Vital signs. See below for your results today.   Medications prescribed:  Please use over-the-counter NSAID medications (ibuprofen, naproxen) as directed on the packaging for pain.   Take any prescribed medications only as directed.  Home care instructions:   Follow any educational materials contained in this packet  Follow R.I.C.E. Protocol:  R - rest your injury   I  - use ice on injury without applying directly to skin  C - compress injury with bandage or splint  E - elevate the injury as much as possible  Follow-up instructions: Please follow-up with your primary care provider or the provided orthopedic physician (bone specialist) if you continue to have significant pain in 1 week. In this case you may have a more severe injury that requires further care.   Return instructions:   Please return if your fingers are numb or tingling, appear gray or blue, or you have severe pain (also elevate the arm and loosen splint or wrap if you were given one)  Please return to the Emergency Department if you experience worsening symptoms.   Please return if you have any other emergent concerns.  Additional Information:  Your vital signs today were: BP 119/84 (BP Location: Left Arm)   Pulse 72   Temp 98.7 F (37.1 C) (Oral)   Resp 18   SpO2 100%  If your blood pressure (BP) was elevated above 135/85 this visit, please have this repeated by your doctor within one month. --------------

## 2019-05-30 NOTE — ED Provider Notes (Signed)
[facility]-EMERGENCY DEPT Provider Note   CSN: [id number] Arrival date & time: 05/30/19  1207     History Chief Complaint  Patient presents with  . Shoulder Pain    Chad Paul is a 29 y.o. male.  Patient presents to the emergency department with acute onset of left shoulder pain with radiation to the left forearm described as throbbing.  Patient was putting a heavy box on a shelf and pushed it with his left arm.  He had immediate onset of pain.  Pain was worse with movement.  He took Advil without improvement.  No numbness or tingling in the hand or wrist.  Course is constant.  No neck pain.        History reviewed. No pertinent past medical history.  There are no problems to display for this patient.   Past Surgical History:  Procedure Laterality Date  . TONSILLECTOMY         No family history on file.  Social History   Tobacco Use  . Smoking status: Current Some Day Smoker  . Smokeless tobacco: Never Used  Substance Use Topics  . Alcohol use: Yes    Comment: social  . Drug use: No    Home Medications Prior to Admission medications   Medication Sig Start Date End Date Taking? Authorizing Provider  ibuprofen (ADVIL) 200 MG tablet Take 400-600 mg by mouth every 6 (six) hours as needed for headache or mild pain.   Yes [provider]  Guaifenesin 1200 MG TB12 Take 1 tablet (1,200 mg total) by mouth 2 (two) times daily. Patient not taking: Reported on 05/30/2019 01/02/18   Charlestine Night, PA-C  ibuprofen (ADVIL,MOTRIN) 800 MG tablet Take 1 tablet (800 mg total) by mouth every 8 (eight) hours as needed. Patient not taking: Reported on 05/30/2019 01/02/18   Charlestine Night, PA-C  promethazine-dextromethorphan (PROMETHAZINE-DM) 6.25-15 MG/5ML syrup Take 10 mLs by mouth 4 (four) times daily as needed for cough. Patient not taking: Reported on 05/30/2019 01/02/18   Charlestine Night, PA-C    Allergies      Peanut-containing drug products and Soy allergy  Review of Systems   Review of Systems  Constitutional: Negative for activity change.  Musculoskeletal: Positive for arthralgias. Negative for back pain, gait problem, joint swelling and neck pain.  Skin: Negative for wound.  Neurological: Negative for weakness and numbness.    Physical Exam Updated Vital Signs BP 119/84 (BP Location: Left Arm)   Pulse 72   Temp 98.7 F (37.1 C) (Oral)   Resp 18   SpO2 100%   Physical Exam Vitals and nursing note reviewed.  Constitutional:      Appearance: He is well-developed.  HENT:     Head: Normocephalic and atraumatic.  Eyes:     Conjunctiva/sclera: Conjunctivae normal.  Cardiovascular:     Pulses: Normal pulses. No decreased pulses.          Radial pulses are 2+ on the right side and 2+ on the left side.  Musculoskeletal:        General: Tenderness present.     Left shoulder: Tenderness and bony tenderness present. No swelling or effusion. Normal range of motion.     Left upper arm: No tenderness.     Left elbow: Normal range of motion. No tenderness.     Left wrist: No tenderness.     Left hand: No tenderness. Normal strength. Normal sensation.     Cervical back: Normal, normal range of motion and neck  supple. No spasms or tenderness. Normal range of motion.  Skin:    General: Skin is warm and dry.  Neurological:     Mental Status: He is alert.     Sensory: No sensory deficit.     Comments: Motor, sensation, and vascular distal to the injury is fully intact.      ED Results / Procedures / Treatments   Labs (all labs ordered are listed, but only abnormal results are displayed) Labs Reviewed - No data to display  EKG None  Radiology DG Shoulder Left  Result Date: 05/30/2019 CLINICAL DATA:  Left shoulder pain EXAM: LEFT SHOULDER - 2+ VIEW COMPARISON:  01/02/2017 FINDINGS: There is no evidence of fracture or dislocation. There is no evidence of arthropathy or other focal  bone abnormality. Soft tissues are unremarkable. IMPRESSION: Negative. Electronically Signed   By: Davina Poke D.O.   On: 05/30/2019 13:35    Procedures Procedures (including critical care time)  Medications Ordered in ED Medications - No data to display  ED Course  I have reviewed the triage vital signs and the nursing notes.  Pertinent labs & imaging results that were available during my care of the patient were reviewed by me and considered in my medical decision making (see chart for details).  Patient seen and examined. X-ray ordered.   Vital signs reviewed and are as follows: BP 119/84 (BP Location: Left Arm)   Pulse 72   Temp 98.7 F (37.1 C) (Oral)   Resp 18   SpO2 100%   2:43 PM X-ray neg. Pt updated.  Encouraged rest, rice protocol, NSAIDs.  Orthopedic follow-up given in case no improvement over the next week.  Encouraged gradual return activity.  Work note given.  Patient urged to return with worsening symptoms or other concerns. Patient verbalized understanding and agrees with plan.     MDM Rules/Calculators/A&P                      L shoulder pain, imaging neg. left upper extremity is neurovascularly intact. Decreased range of motion.  Concern measures indicated at this time.   Final Clinical Impression(s) / ED Diagnoses Final diagnoses:  Strain of left shoulder, initial encounter    Rx / DC Orders ED Discharge Orders    None       Carlisle Cater, PA-C 05/30/19 1444    Sherwood Gambler, MD 05/30/19 1511

## 2019-05-30 NOTE — ED Triage Notes (Signed)
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

## 2020-04-20 IMAGING — CR DG CHEST 2V
2 series · 2 of 2 positions shown · non-contrast
Comparison: No prior.

CLINICAL DATA: New onset cough.

EXAM:
CHEST - 2 VIEW

[w chest pa]
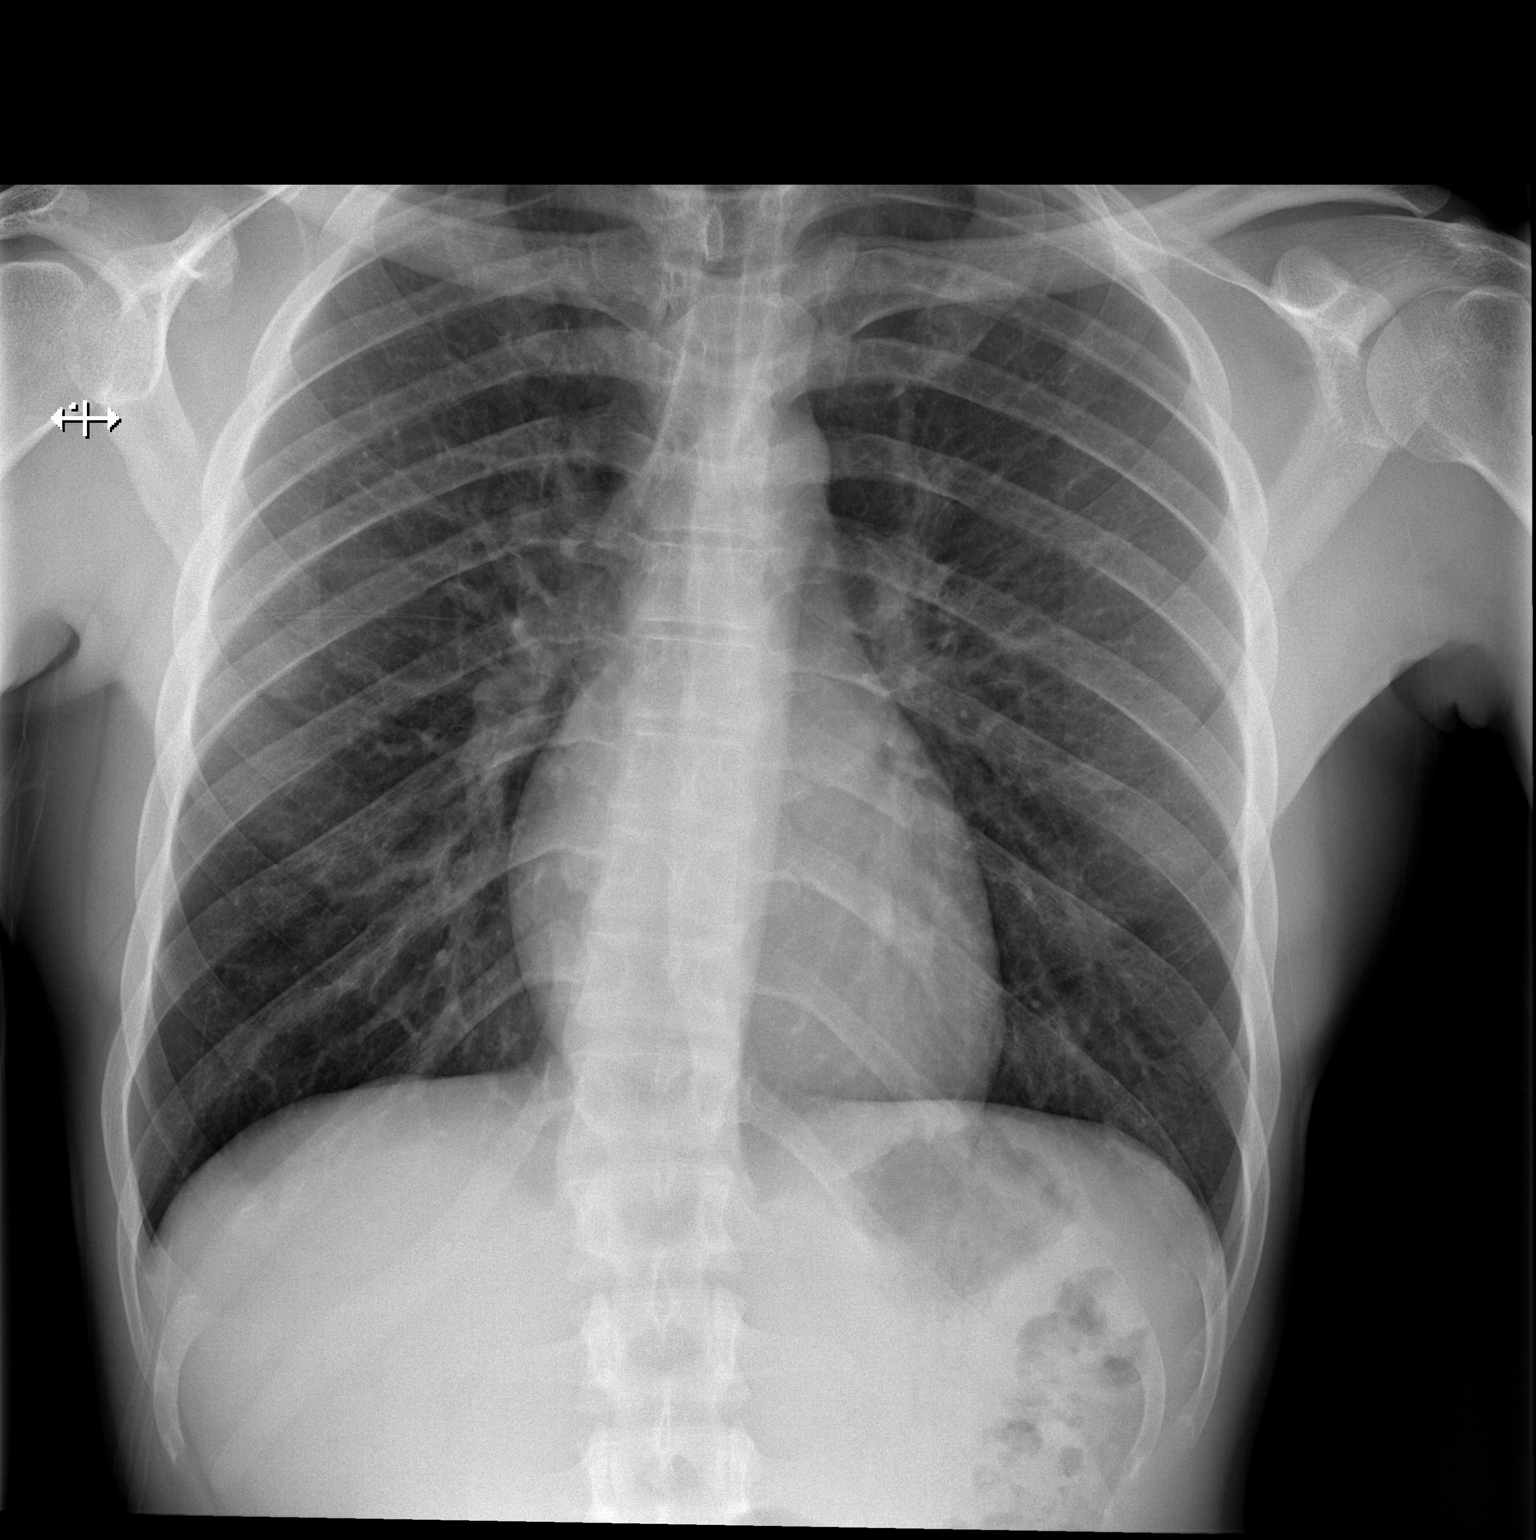

[w chest lat]
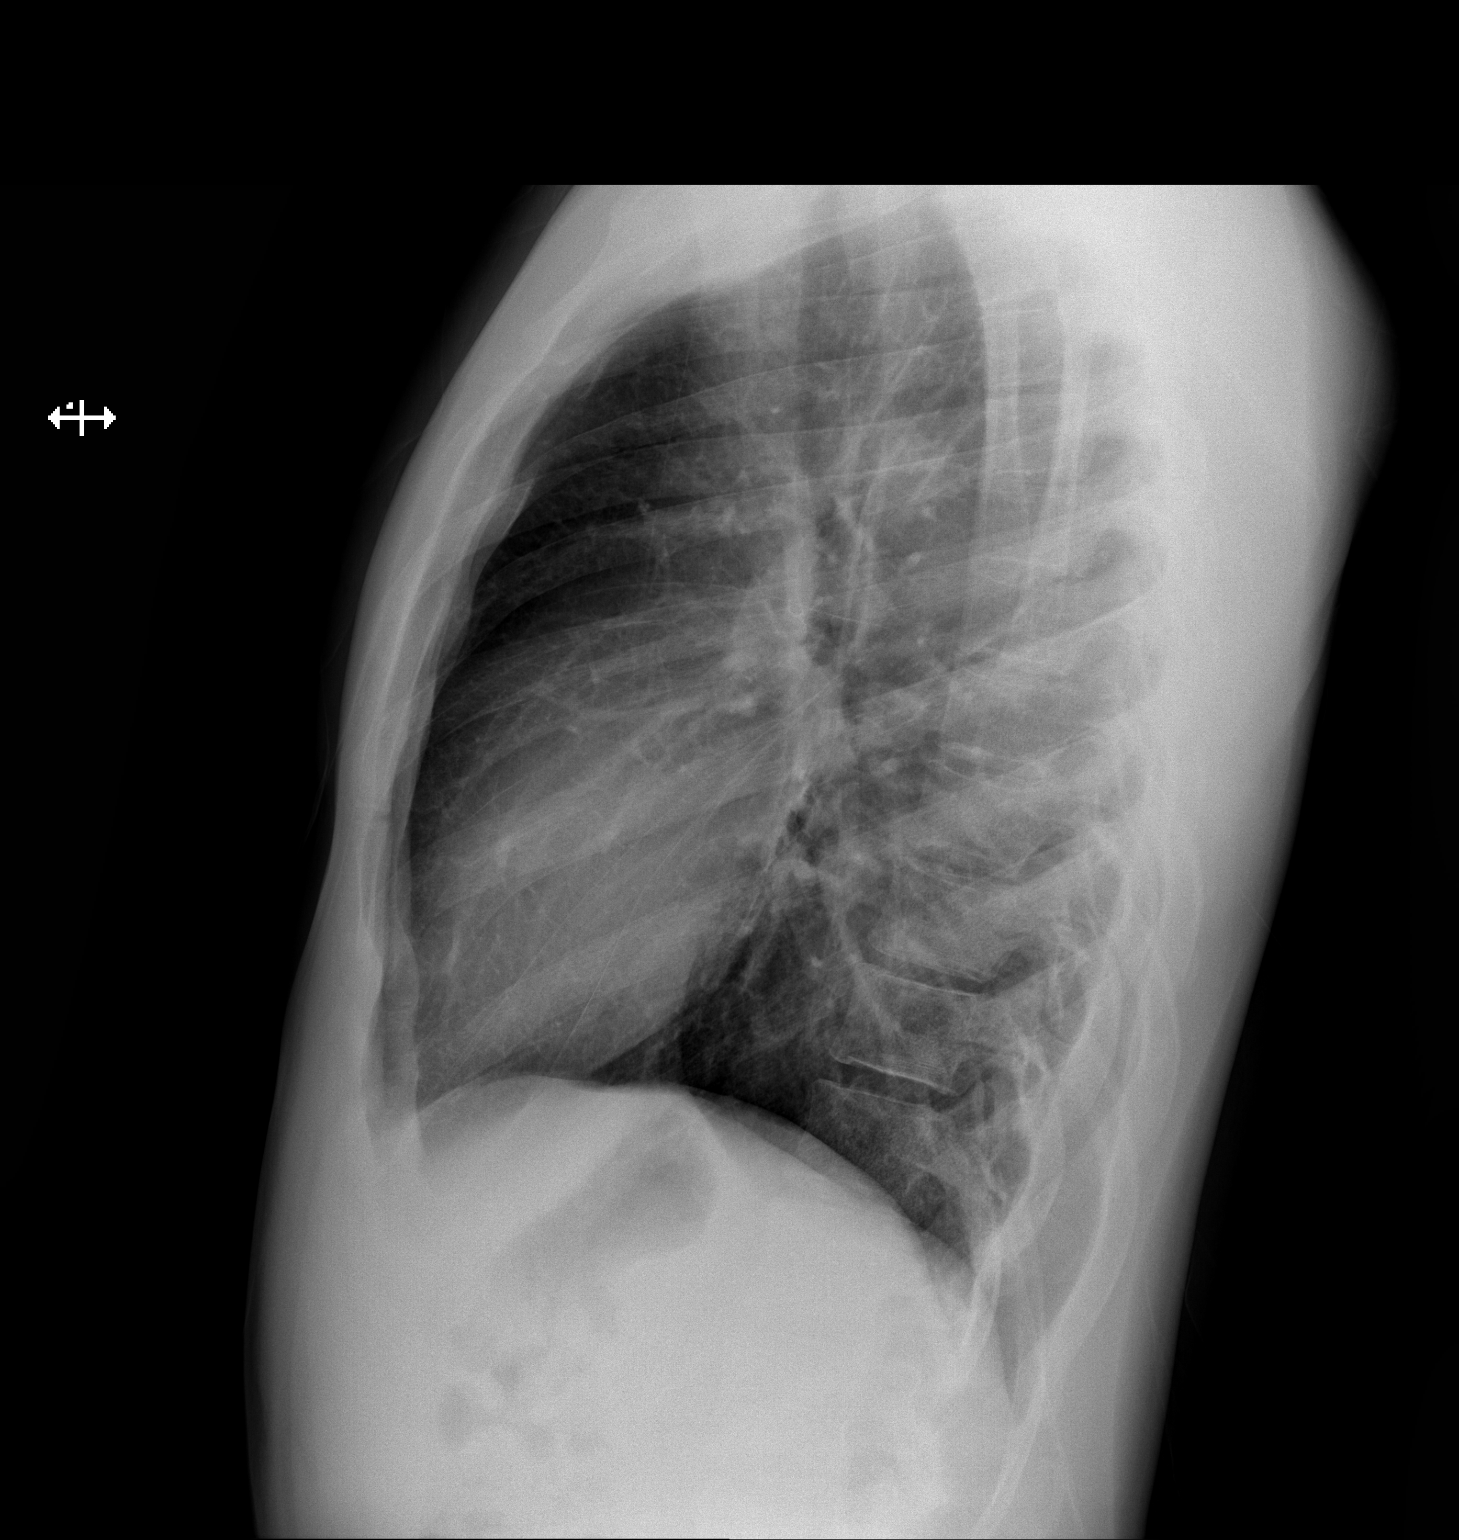

[2 of 2 positions shown; findings below may reference images not displayed]

FINDINGS: Mediastinum hilar structures normal. Lungs are clear. No pleural
effusion or pneumothorax. Heart size normal. No acute bony
abnormality identified
IMPRESSION: No acute cardiopulmonary disease.

## 2020-08-24 ENCOUNTER — Encounter (HOSPITAL_COMMUNITY): Payer: Self-pay | Admitting: *Deleted

## 2020-08-24 ENCOUNTER — Other Ambulatory Visit: Payer: Self-pay

## 2020-08-24 ENCOUNTER — Emergency Department (HOSPITAL_COMMUNITY)
Admission: EM | Admit: 2020-08-24 | Discharge: 2020-08-24 | Disposition: A | Discharge status: Home / Self Care | Payer: Self-pay | Attending: Emergency Medicine | Admitting: Emergency Medicine

## 2020-08-24 ENCOUNTER — Emergency Department (HOSPITAL_COMMUNITY): Payer: Self-pay

## 2020-08-24 DIAGNOSIS — K529 Noninfective gastroenteritis and colitis, unspecified: Secondary | ICD-10-CM

## 2020-08-24 DIAGNOSIS — R066 Hiccough: Secondary | ICD-10-CM | POA: Insufficient documentation

## 2020-08-24 DIAGNOSIS — J029 Acute pharyngitis, unspecified: Secondary | ICD-10-CM | POA: Insufficient documentation

## 2020-08-24 DIAGNOSIS — R1013 Epigastric pain: Secondary | ICD-10-CM | POA: Insufficient documentation

## 2020-08-24 DIAGNOSIS — F17200 Nicotine dependence, unspecified, uncomplicated: Secondary | ICD-10-CM | POA: Insufficient documentation

## 2020-08-24 DIAGNOSIS — R1033 Periumbilical pain: Secondary | ICD-10-CM | POA: Insufficient documentation

## 2020-08-24 DIAGNOSIS — R1012 Left upper quadrant pain: Secondary | ICD-10-CM | POA: Insufficient documentation

## 2020-08-24 DIAGNOSIS — R197 Diarrhea, unspecified: Secondary | ICD-10-CM | POA: Insufficient documentation

## 2020-08-24 DIAGNOSIS — R112 Nausea with vomiting, unspecified: Secondary | ICD-10-CM | POA: Insufficient documentation

## 2020-08-24 LAB — URINALYSIS, ROUTINE W REFLEX MICROSCOPIC
Bilirubin Urine: NEGATIVE
Glucose, UA: NEGATIVE mg/dL
Hgb urine dipstick: NEGATIVE
Ketones, ur: 5 mg/dL — AB
Leukocytes,Ua: NEGATIVE
Nitrite: NEGATIVE
Protein, ur: NEGATIVE mg/dL
Specific Gravity, Urine: 1.028 (ref 1.005–1.030)
pH: 5 (ref 5.0–8.0)

## 2020-08-24 LAB — COMPREHENSIVE METABOLIC PANEL
ALT: 18 U/L (ref 0–44)
AST: 20 U/L (ref 15–41)
Albumin: 5.1 g/dL — ABNORMAL HIGH (ref 3.5–5.0)
Alkaline Phosphatase: 61 U/L (ref 38–126)
Anion gap: 9 (ref 5–15)
BUN: 15 mg/dL (ref 6–20)
CO2: 28 mmol/L (ref 22–32)
Calcium: 9.9 mg/dL (ref 8.9–10.3)
Chloride: 103 mmol/L (ref 98–111)
Creatinine, Ser: 1.13 mg/dL (ref 0.61–1.24)
GFR, Estimated: >60 mL/min (ref >60)
Glucose, Bld: 95 mg/dL (ref 70–99)
Potassium: 3.8 mmol/L (ref 3.5–5.1)
Sodium: 140 mmol/L (ref 135–145)
Total Bilirubin: 1.1 mg/dL (ref 0.3–1.2)
Total Protein: 8.1 g/dL (ref 6.5–8.1)

## 2020-08-24 LAB — CBC
HCT: 48.1 % (ref 39.0–52.0)
Hemoglobin: 16.1 g/dL (ref 13.0–17.0)
MCH: 31.6 pg (ref 26.0–34.0)
MCHC: 33.5 g/dL (ref 30.0–36.0)
MCV: 94.3 fL (ref 80.0–100.0)
Platelets: 195 10*3/uL (ref 150–400)
RBC: 5.1 MIL/uL (ref 4.22–5.81)
RDW: 13.7 % (ref 11.5–15.5)
WBC: 10.9 10*3/uL — ABNORMAL HIGH (ref 4.0–10.5)
nRBC: 0 % (ref 0.0–0.2)

## 2020-08-24 LAB — LIPASE, BLOOD: Lipase: 30 U/L (ref 11–51)

## 2020-08-24 MED ORDER — DICYCLOMINE HCL 20 MG PO TABS: 20.0000 mg | ORAL_TABLET | Freq: Two times a day (BID) | ORAL | 0 refills | Status: AC

## 2020-08-24 MED ORDER — MORPHINE SULFATE (PF) 4 MG/ML IV SOLN
4.0000 mg | Freq: Once | INTRAVENOUS | Status: AC
  Administered 2020-08-24: 4 mg via INTRAVENOUS
  Filled 2020-08-24: qty 1

## 2020-08-24 MED ORDER — ONDANSETRON HCL 4 MG/2ML IJ SOLN
4.0000 mg | Freq: Once | INTRAMUSCULAR | Status: AC
  Administered 2020-08-24: 4 mg via INTRAVENOUS
  Filled 2020-08-24: qty 2

## 2020-08-24 MED ORDER — SODIUM CHLORIDE 0.9 % IV BOLUS
1000.0000 mL | Freq: Once | INTRAVENOUS | Status: AC
  Administered 2020-08-24: 1000 mL via INTRAVENOUS

## 2020-08-24 MED ORDER — ONDANSETRON HCL 4 MG PO TABS: 4.0000 mg | ORAL_TABLET | Freq: Four times a day (QID) | ORAL | 0 refills | Status: AC

## 2020-08-24 NOTE — ED Notes (Signed)
Pt tolerated food and water well.

## 2020-08-24 NOTE — ED Triage Notes (Signed)
Pt complains of abdominal pain, diarrhea, vomiting. He noticed some blood in his vomit. Also reports sore throat, cough.

## 2020-08-24 NOTE — Discharge Instructions (Addendum)
Take Zofran every 6 hours as needed for nausea and vomiting.  You may also take Bentyl twice daily for abdominal pain.  This is a viral gastroenteritis which is what I suspected will pass in the next 24 to 48 hours.  If you still feel bad in 48 hours please return back to the ED for further evaluation.

## 2020-08-24 NOTE — ED Provider Notes (Signed)
[facility]-EMERGENCY DEPT Provider Note   CSN: [id number] Arrival date & time: 08/24/20  1647     History Chief Complaint  Patient presents with   Abdominal Pain    Chad Paul is a 30 y.o. male.   Abdominal Pain Associated symptoms: diarrhea, nausea, sore throat and vomiting   Associated symptoms: no chest pain, no chills, no constipation, no cough, no dysuria, no fever and no shortness of breath    Patient presents with emesis, diarrhea, abdominal pain since this morning.  He has had 4 episodes of emesis, 2 episodes of diarrhea.  The abdominal pain is central, worse in the left side.   It is sharp, does not radiate to the back. It is worsened by moving and hiccuping, it is alleviated somewhat by Tylenol.  Reports she has been unable to tolerate any oral intake since this morning.  There is no dysuria or hematuria.  Denies any recent alcohol use, does not recreationally use drugs, no heavy NSAID use.   No prior abdominal surgeries, no new restaurants, no one at the home is sick.  Reports his girlfriend recently had a viral pharyngitis a week ago but that resolved.  History reviewed. No pertinent past medical history.  There are no problems to display for this patient.   Past Surgical History:  Procedure Laterality Date   TONSILLECTOMY         No family history on file.  Social History   Tobacco Use   Smoking status: Some Days   Smokeless tobacco: Never  Substance Use Topics   Alcohol use: Yes    Comment: social   Drug use: No    Home Medications Prior to Admission medications   Medication Sig Start Date End Date Taking? Authorizing Provider  ibuprofen (ADVIL) 200 MG tablet Take 400-600 mg by mouth every 6 (six) hours as needed for headache or mild pain.    [provider]    Allergies    Peanut-containing drug products and Soy allergy  Review of Systems   Review of Systems  Constitutional:  Negative for chills and fever.   HENT:  Positive for sore throat.   Respiratory:  Negative for cough and shortness of breath.   Cardiovascular:  Negative for chest pain.  Gastrointestinal:  Positive for abdominal pain, diarrhea, nausea and vomiting. Negative for blood in stool and constipation.  Genitourinary:  Negative for dysuria.  Musculoskeletal:  Negative for myalgias.   Physical Exam Updated Vital Signs BP 131/90   Pulse 65   Temp 98.5 F (36.9 C) (Oral)   Resp 17   SpO2 100%   Physical Exam Vitals and nursing note reviewed. Exam conducted with a chaperone present.  Constitutional:      General: He is not in acute distress.    Appearance: Normal appearance.  HENT:     Head: Normocephalic and atraumatic.  Eyes:     General: No scleral icterus.       Right eye: No discharge.        Left eye: No discharge.     Extraocular Movements: Extraocular movements intact.     Pupils: Pupils are equal, round, and reactive to light.  Cardiovascular:     Rate and Rhythm: Normal rate and regular rhythm.     Pulses: Normal pulses.     Heart sounds: Normal heart sounds. No murmur heard.   No friction rub. No gallop.  Pulmonary:     Effort: Pulmonary effort is normal. No respiratory distress.  Breath sounds: Normal breath sounds.  Abdominal:     General: Abdomen is flat. Bowel sounds are normal. There is no distension.     Palpations: Abdomen is soft.     Tenderness: There is abdominal tenderness in the epigastric area, periumbilical area and left upper quadrant. There is no guarding.  Skin:    General: Skin is warm and dry.     Coloration: Skin is not jaundiced.  Neurological:     Mental Status: He is alert. Mental status is at baseline.     Coordination: Coordination normal.    ED Results / Procedures / Treatments   Labs (all labs ordered are listed, but only abnormal results are displayed) Labs Reviewed  COMPREHENSIVE METABOLIC PANEL - Abnormal; Notable for the following components:      Result Value    Albumin 5.1 (*)    All other components within normal limits  CBC - Abnormal; Notable for the following components:   WBC 10.9 (*)    All other components within normal limits  LIPASE, BLOOD  URINALYSIS, ROUTINE W REFLEX MICROSCOPIC    EKG None  Radiology No results found.  Procedures Procedures   Medications Ordered in ED Medications  ondansetron (ZOFRAN) injection 4 mg (has no administration in time range)  sodium chloride 0.9 % bolus 1,000 mL (has no administration in time range)  morphine 4 MG/ML injection 4 mg (has no administration in time range)    ED Course  I have reviewed the triage vital signs and the nursing notes.  Pertinent labs & imaging results that were available during my care of the patient were reviewed by me and considered in my medical decision making (see chart for details).  Clinical Course as of 08/24/20 2256  Mon Aug 24, 2020  1959 Lipase, blood Not consistent with pancreatitis [HS]  2018 CBC(!) Mild leukocytosis, no anemia [HS]  2018 Comprehensive metabolic panel(!) No electrolyte derangement, no AKI, no elevated LFTs consistent with a biliary process or hepatitis. [HS]    Clinical Course User Index [HS] Theron Arista, PA-C   MDM Rules/Calculators/A&P                           Patient presents with abdominal pain, nausea, vomiting.   Differential includes cholelithiasis, gastroenteritis, gastritis, pancreatitis, appendicitis.  Pain is primarily periumbilical, LUQ, epigastric, does not radiate to the back making pancreatitis somewhat less likely.  Lipase is also not elevated, I doubt pancreatitis.  No significant leukocytosis, do not suspect emergent infectious etiology.  Additionally there is no right lower quadrant tenderness making appendicitis less likely, he is also not febrile.   Patient creatinine and nausea have improved, I suspect his symptoms are primarily due to gastroenteritis.  Reevaluation: Patient pain is improved and he  is no longer nauseated.  P.o. challenge successful.  Will discharge with Zofran and Bentyl, discussed return precautions.  Advised him that if symptoms do not improve within the next 2 days he is to return back to the ED.    Final Clinical Impression(s) / ED Diagnoses Final diagnoses:  None    Rx / DC Orders ED Discharge Orders     None        Theron Arista, Cordelia Poche 08/24/20 2258    Margarita Grizzle, MD 08/24/20 [phone]

## 2020-08-24 NOTE — ED Provider Notes (Signed)
Emergency Medicine Provider Triage Evaluation Note  Chad Paul , a 30 y.o. male  was evaluated in triage.  Pt complains of abdominal pain periumbilical of sudden onset today, had some pizza and wings last night.  Reports no improvement in symptoms, has had multiple episodes of vomiting, reports some blood in his emesis, along with diarrhea.  Has not been able to keep any fluids or solids down in the last 24 hours.  No prior surgical interventions to his abdomen, no prior history of GI bleed.  Review of Systems  Positive: Abdominal pain, nausea, vomiting Negative: Fever, chills, chest pain  Physical Exam  BP 117/75 (BP Location: Right Arm)   Pulse 63   Temp 98.5 F (36.9 C) (Oral)   Resp 18   SpO2 96%  Gen:   Awake, no distress   Resp:  Normal effort  MSK:   Moves extremities without difficulty  Other:  TTP along the periumbilical region, bowel sounds present in all quadrants, non toxic appearance.   Medical Decision Making  Medically screening exam initiated at 5:35 PM.  Appropriate orders placed.  Chad Paul was informed that the remainder of the evaluation will be completed by another provider, this initial triage assessment does not replace that evaluation, and the importance of remaining in the ED until their evaluation is complete.  Labs have been placed   Claude Manges, PA-C 08/24/20 1736    Mancel Bale, MD 08/25/20 1016

## 2022-03-07 ENCOUNTER — Encounter (HOSPITAL_COMMUNITY): Payer: Self-pay

## 2022-03-07 ENCOUNTER — Other Ambulatory Visit: Payer: Self-pay

## 2022-03-07 ENCOUNTER — Emergency Department (HOSPITAL_COMMUNITY): Payer: BC Managed Care – PPO

## 2022-03-07 ENCOUNTER — Emergency Department (HOSPITAL_COMMUNITY)
Admission: EM | Admit: 2022-03-07 | Discharge: 2022-03-07 | Disposition: A | Discharge status: Home / Self Care | Payer: BC Managed Care – PPO | Attending: Emergency Medicine | Admitting: Emergency Medicine

## 2022-03-07 DIAGNOSIS — K625 Hemorrhage of anus and rectum: Secondary | ICD-10-CM | POA: Diagnosis not present

## 2022-03-07 DIAGNOSIS — R1032 Left lower quadrant pain: Secondary | ICD-10-CM | POA: Diagnosis present

## 2022-03-07 DIAGNOSIS — Z91010 Allergy to peanuts: Secondary | ICD-10-CM | POA: Diagnosis not present

## 2022-03-07 LAB — CBC WITH DIFFERENTIAL/PLATELET
Abs Immature Granulocytes: 0.05 10*3/uL (ref 0.00–0.07)
Basophils Absolute: 0.1 10*3/uL (ref 0.0–0.1)
Basophils Relative: 0 %
Eosinophils Absolute: 0.1 10*3/uL (ref 0.0–0.5)
Eosinophils Relative: 0 %
HCT: 44.7 % (ref 39.0–52.0)
Hemoglobin: 15.1 g/dL (ref 13.0–17.0)
Immature Granulocytes: 0 %
Lymphocytes Relative: 9 %
Lymphs Abs: 1.1 10*3/uL (ref 0.7–4.0)
MCH: 31.3 pg (ref 26.0–34.0)
MCHC: 33.8 g/dL (ref 30.0–36.0)
MCV: 92.5 fL (ref 80.0–100.0)
Monocytes Absolute: 0.6 10*3/uL (ref 0.1–1.0)
Monocytes Relative: 5 %
Neutro Abs: 10.8 10*3/uL — ABNORMAL HIGH (ref 1.7–7.7)
Neutrophils Relative %: 86 %
Platelets: 187 10*3/uL (ref 150–400)
RBC: 4.83 MIL/uL (ref 4.22–5.81)
RDW: 13.4 % (ref 11.5–15.5)
WBC: 12.7 10*3/uL — ABNORMAL HIGH (ref 4.0–10.5)
nRBC: 0 % (ref 0.0–0.2)

## 2022-03-07 LAB — COMPREHENSIVE METABOLIC PANEL
ALT: 18 U/L (ref 0–44)
AST: 21 U/L (ref 15–41)
Albumin: 4.5 g/dL (ref 3.5–5.0)
Alkaline Phosphatase: 53 U/L (ref 38–126)
Anion gap: 9 (ref 5–15)
BUN: 18 mg/dL (ref 6–20)
CO2: 26 mmol/L (ref 22–32)
Calcium: 9.1 mg/dL (ref 8.9–10.3)
Chloride: 106 mmol/L (ref 98–111)
Creatinine, Ser: 0.93 mg/dL (ref 0.61–1.24)
GFR, Estimated: >60 mL/min (ref >60)
Glucose, Bld: 99 mg/dL (ref 70–99)
Potassium: 3.6 mmol/L (ref 3.5–5.1)
Sodium: 141 mmol/L (ref 135–145)
Total Bilirubin: 0.9 mg/dL (ref 0.3–1.2)
Total Protein: 7.4 g/dL (ref 6.5–8.1)

## 2022-03-07 LAB — TYPE AND SCREEN
ABO/RH(D): O POS
Antibody Screen: NEGATIVE

## 2022-03-07 MED ORDER — LACTATED RINGERS IV SOLN: INTRAVENOUS | Status: DC

## 2022-03-07 MED ORDER — IOHEXOL 300 MG/ML  SOLN
100.0000 mL | Freq: Once | INTRAMUSCULAR | Status: AC | PRN
  Administered 2022-03-07: 100 mL via INTRAVENOUS

## 2022-03-07 MED ORDER — SODIUM CHLORIDE (PF) 0.9 % IJ SOLN
INTRAMUSCULAR | Status: AC
  Filled 2022-03-07: qty 50

## 2022-03-07 NOTE — ED Triage Notes (Addendum)
Patient said since 1am he has had lower abdominal pain under his belly button. At 8am he had 1 bright red bloody bowel movement. Not on blood thinners. Took 524m tylenol before arrival. Vomited x3 along with diarrhea.

## 2022-03-07 NOTE — ED Provider Notes (Signed)
[facility] Provider Note   CSN: [id number] Arrival date & time: 03/07/22  1030     History  Chief Complaint  Patient presents with   Abdominal Pain    Chad Paul is a 32 y.o. male.  32 year old male presents with left lower quadrant abdominal pain.  Patient started after he was try to move his bowels.  Noted blood in the toilet water.  Patient did show me a picture of this and there is bright red blood in the commode with stool around it.  He denies any history of colitis in his family or himself.  No fever or chills.  Has had some nausea vomiting.  No prior history of same.  Does not take any blood thinners.  Has used Tylenol with some relief.  Denies any urinary symptoms       Home Medications Prior to Admission medications   Medication Sig Start Date End Date Taking? Authorizing Provider  acetaminophen (TYLENOL) 500 MG tablet Take 500 mg by mouth every 6 (six) hours as needed for moderate pain.    [provider]  cetirizine (ZYRTEC) 10 MG tablet Take 10 mg by mouth daily as needed for allergies.    [provider]  dicyclomine (BENTYL) 20 MG tablet Take 1 tablet (20 mg total) by mouth 2 (two) times daily. 08/24/20   Sherrill Raring, PA-C  ibuprofen (ADVIL) 200 MG tablet Take 400-600 mg by mouth every 6 (six) hours as needed for headache or mild pain.    [provider]  ondansetron (ZOFRAN) 4 MG tablet Take 1 tablet (4 mg total) by mouth every 6 (six) hours. 08/24/20   Sherrill Raring, PA-C      Allergies    Peanut-containing drug products and Soy allergy    Review of Systems   Review of Systems  All other systems reviewed and are negative.   Physical Exam Updated Vital Signs BP 130/68 (BP Location: Left Arm)   Pulse 73   Temp 98.3 F (36.8 C) (Oral)   Resp 19   Ht 1.803 m (5' 11"$ )   Wt 70.3 kg   SpO2 100%   BMI 21.62 kg/m  Physical Exam Vitals and nursing note reviewed. Exam conducted with a  chaperone present.  Constitutional:      General: He is not in acute distress.    Appearance: Normal appearance. He is well-developed. He is not toxic-appearing.  HENT:     Head: Normocephalic and atraumatic.  Eyes:     General: Lids are normal.     Conjunctiva/sclera: Conjunctivae normal.     Pupils: Pupils are equal, round, and reactive to light.  Neck:     Thyroid: No thyroid mass.     Trachea: No tracheal deviation.  Cardiovascular:     Rate and Rhythm: Normal rate and regular rhythm.     Heart sounds: Normal heart sounds. No murmur heard.    No gallop.  Pulmonary:     Effort: Pulmonary effort is normal. No respiratory distress.     Breath sounds: Normal breath sounds. No stridor. No decreased breath sounds, wheezing, rhonchi or rales.  Abdominal:     General: There is no distension.     Palpations: Abdomen is soft.     Tenderness: There is abdominal tenderness in the left lower quadrant. There is no rebound.  Genitourinary:    Comments: No blood noted on digital exam Musculoskeletal:        General: No tenderness.  Normal range of motion.     Cervical back: Normal range of motion and neck supple.  Skin:    General: Skin is warm and dry.     Findings: No abrasion or rash.  Neurological:     Mental Status: He is alert and oriented to person, place, and time. Mental status is at baseline.     GCS: GCS eye subscore is 4. GCS verbal subscore is 5. GCS motor subscore is 6.     Cranial Nerves: No cranial nerve deficit.     Sensory: No sensory deficit.     Motor: Motor function is intact.  Psychiatric:        Attention and Perception: Attention normal.        Speech: Speech normal.        Behavior: Behavior normal.     ED Results / Procedures / Treatments   Labs (all labs ordered are listed, but only abnormal results are displayed) Labs Reviewed - No data to display  EKG None  Radiology No results found.  Procedures Procedures    Medications Ordered in  ED Medications  lactated ringers infusion (has no administration in time range)    ED Course/ Medical Decision Making/ A&P                             Medical Decision Making Amount and/or Complexity of Data Reviewed Labs: ordered. Radiology: ordered.  Risk Prescription drug management.  Patient presented with rectal bleeding and no evidence of that here.  Patient's hemoglobin is stable at 15.  Abdominal CT performed rule out colitis and per my review interpretation did not show any evidence of that.  Suspect the patient may have an internal hemorrhoid versus anal fissure.  No indication for admission at this time.  Will discharge home and give GI referral.  Case discussed with wife at bedside who is in agreement         Final Clinical Impression(s) / ED Diagnoses Final diagnoses:  None    Rx / DC Orders ED Discharge Orders     None         Lacretia Leigh, MD 03/07/22 1437

## 2022-12-11 IMAGING — DX DG CHEST 1V PORT
1 series · 1 of 1 positions shown · non-contrast
Comparison: 01/02/2018

CLINICAL DATA: Hematemesis

EXAM:
PORTABLE CHEST 1 VIEW

[chest ap]
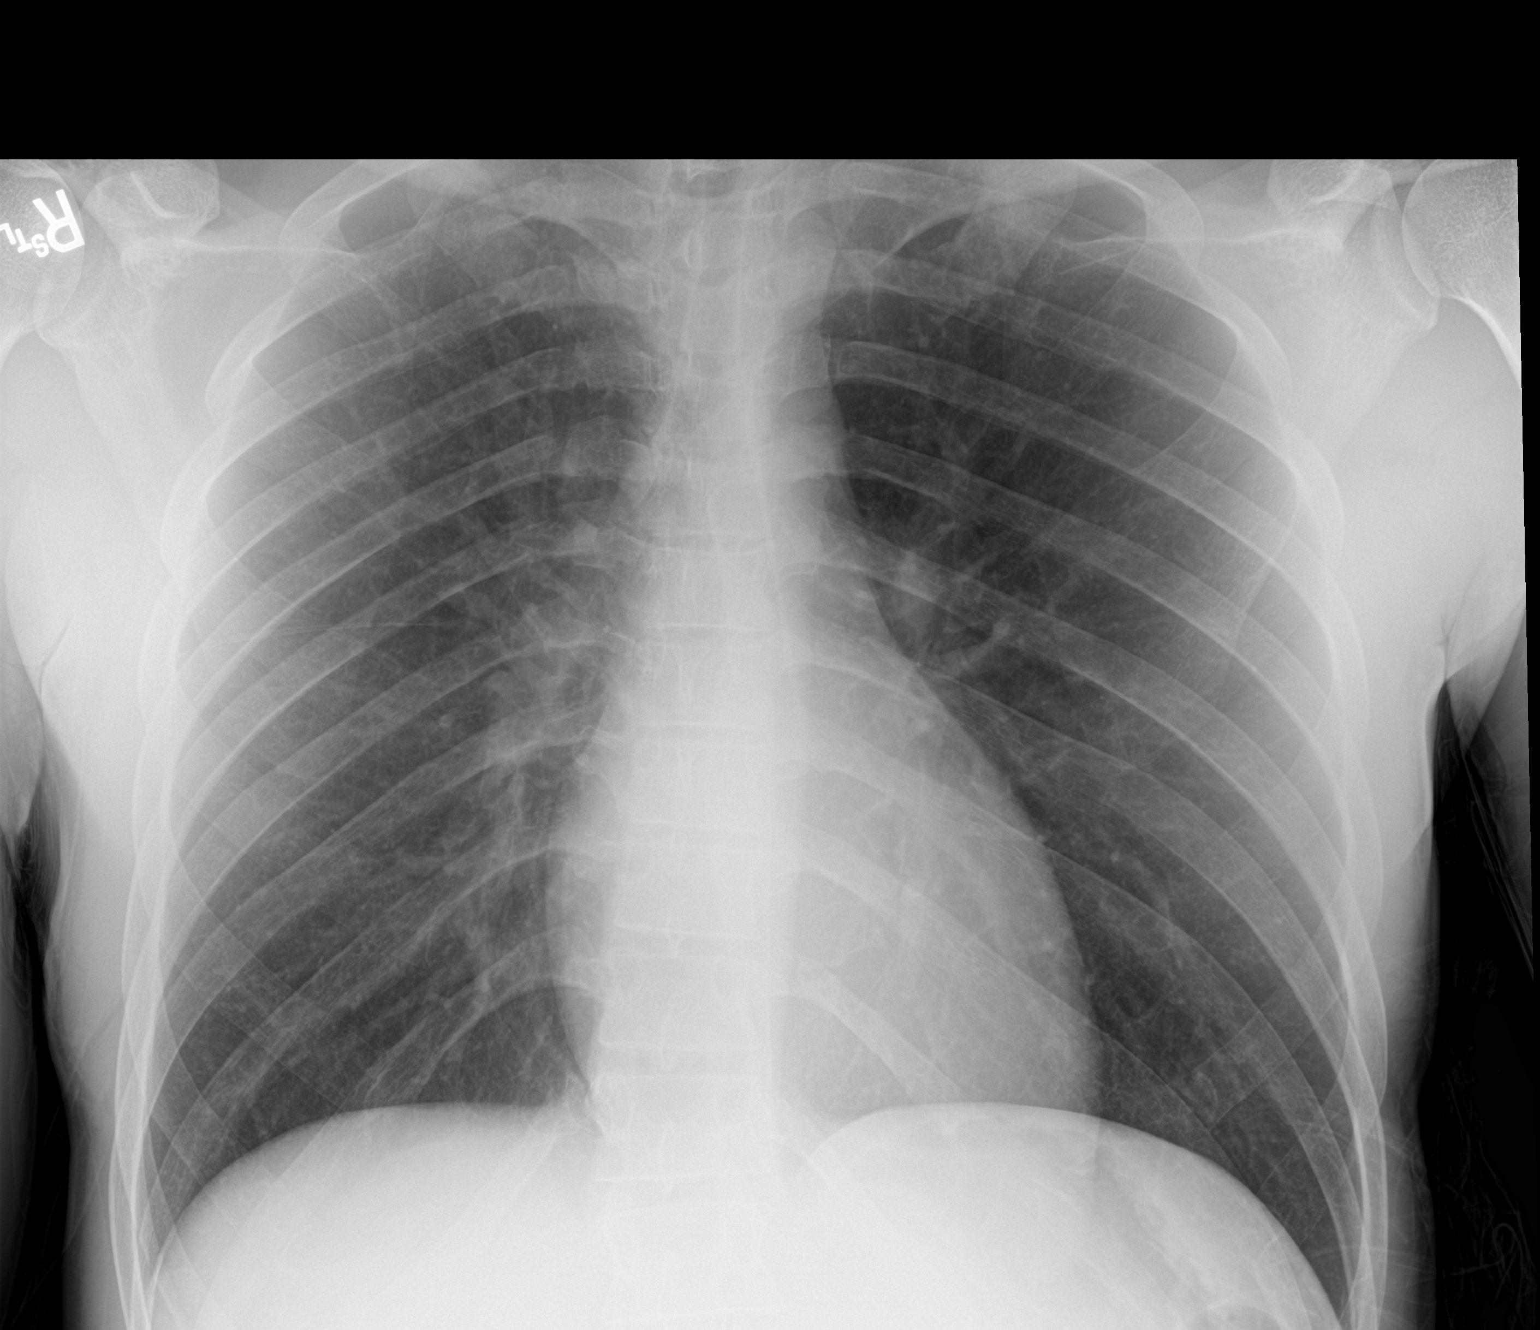

[1 of 1 positions shown; findings below may reference images not displayed]

FINDINGS: The heart size and mediastinal contours are within normal limits.
Both lungs are clear. The visualized skeletal structures are
unremarkable.
IMPRESSION: No active disease.

## 2023-10-19 ENCOUNTER — Emergency Department (HOSPITAL_COMMUNITY)
Admission: EM | Admit: 2023-10-19 | Discharge: 2023-10-19 | Disposition: A | Discharge status: Home / Self Care | Payer: Self-pay | Attending: Emergency Medicine | Admitting: Emergency Medicine

## 2023-10-19 ENCOUNTER — Emergency Department (HOSPITAL_COMMUNITY): Payer: Self-pay

## 2023-10-19 DIAGNOSIS — W540XXA Bitten by dog, initial encounter: Secondary | ICD-10-CM | POA: Insufficient documentation

## 2023-10-19 DIAGNOSIS — Z91010 Allergy to peanuts: Secondary | ICD-10-CM | POA: Diagnosis not present

## 2023-10-19 DIAGNOSIS — Z23 Encounter for immunization: Secondary | ICD-10-CM | POA: Diagnosis not present

## 2023-10-19 DIAGNOSIS — S81811A Laceration without foreign body, right lower leg, initial encounter: Secondary | ICD-10-CM | POA: Insufficient documentation

## 2023-10-19 DIAGNOSIS — S81851A Open bite, right lower leg, initial encounter: Secondary | ICD-10-CM | POA: Diagnosis present

## 2023-10-19 MED ORDER — AMOXICILLIN-POT CLAVULANATE 875-125 MG PO TABS
1.0000 | ORAL_TABLET | Freq: Once | ORAL | Status: AC
  Administered 2023-10-19: 1 via ORAL
  Filled 2023-10-19: qty 1

## 2023-10-19 MED ORDER — AMOXICILLIN-POT CLAVULANATE 875-125 MG PO TABS: 1.0000 | ORAL_TABLET | Freq: Two times a day (BID) | ORAL | 0 refills | Status: AC

## 2023-10-19 MED ORDER — TETANUS-DIPHTH-ACELL PERTUSSIS 5-2-15.5 LF-MCG/0.5 IM SUSP
0.5000 mL | Freq: Once | INTRAMUSCULAR | Status: AC
  Administered 2023-10-19: 0.5 mL via INTRAMUSCULAR
  Filled 2023-10-19: qty 0.5

## 2023-10-19 NOTE — Discharge Instructions (Addendum)
 These follow-up with the animal control to get clarification on rabies vaccination status of the dog.  In the meantime keep area clean dry and covered.  He can wash daily with gentle soap and water.  Take antibiotic as prescribed.  Return to ER with new or worsening symptoms.

## 2023-10-19 NOTE — ED Triage Notes (Signed)
 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 Unsure of dog's vaccine status.

## 2023-10-19 NOTE — ED Provider Notes (Signed)
  EMERGENCY DEPARTMENT AT [facility] Provider Note   CSN: [id number] Arrival date & time: 10/19/23  8167     Patient presents with: Animal Bite   Chad Paul is a 33 y.o. male.  Contributory past medical history reports to emergency room with dog bite to right leg.  This occurred just prior to arrival.  Patient knows which address the dog lives at but was unable to clarify vaccination status.  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    Animal Bite      Prior to Admission medications   Medication Sig Start Date End Date Taking? Authorizing Provider  acetaminophen (TYLENOL) 500 MG tablet Take 500 mg by mouth every 6 (six) hours as needed for moderate pain.    [provider]  cetirizine (ZYRTEC) 10 MG tablet Take 10 mg by mouth daily as needed for allergies.    [provider]  dicyclomine  (BENTYL ) 20 MG tablet Take 1 tablet (20 mg total) by mouth 2 (two) times daily. 08/24/20   Emelia Sluder, PA-C  ibuprofen  (ADVIL ) 200 MG tablet Take 400-600 mg by mouth every 6 (six) hours as needed for headache or mild pain.    [provider]  ondansetron  (ZOFRAN ) 4 MG tablet Take 1 tablet (4 mg total) by mouth every 6 (six) hours. 08/24/20   Emelia Sluder, PA-C    Allergies: Peanut-containing drug products and Soy allergy (obsolete)    Review of Systems  Skin:  Positive for wound.    Updated Vital Signs BP 134/78 (BP Location: Right Arm)   Pulse 77   Temp 98.6 F (37 C) (Oral)   Resp (!) 24   Wt 74.9 kg   SpO2 99%   BMI 23.04 kg/m   Physical Exam Vitals and nursing note reviewed.  Constitutional:      General: He is not in acute distress.    Appearance: He is not toxic-appearing.  HENT:     Head: Normocephalic and atraumatic.  Eyes:     General: No scleral icterus.    Conjunctiva/sclera: Conjunctivae normal.  Cardiovascular:     Rate and Rhythm: Normal rate and regular rhythm.      Pulses: Normal pulses.     Heart sounds: Normal heart sounds.  Pulmonary:     Effort: Pulmonary effort is normal. No respiratory distress.     Breath sounds: Normal breath sounds.  Abdominal:     General: Abdomen is flat. Bowel sounds are normal.     Palpations: Abdomen is soft.     Tenderness: There is no abdominal tenderness.  Musculoskeletal:     Right lower leg: No edema.     Left lower leg: No edema.     Comments: Superficial wound to distal right shin.  Compartments soft.  Strong dorsal pedal pulse.  Skin:    General: Skin is warm and dry.     Findings: No lesion.  Neurological:     General: No focal deficit present.     Mental Status: He is alert and oriented to person, place, and time. Mental status is at baseline.     (all labs ordered are listed, but only abnormal results are displayed) Labs Reviewed - No data to display  EKG: None  Radiology: DG Tibia/Fibula Right Result Date: 10/19/2023 EXAM: VIEW(S) XRAY OF THE RIGHT TIBIA AND FIBULA 10/19/2023 07:31:00 PM COMPARISON: None available. CLINICAL HISTORY: Dog BIte. 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 FINDINGS: BONES AND JOINTS: No acute fracture. No focal osseous lesion. No joint dislocation. SOFT TISSUES: The soft tissues are unremarkable. IMPRESSION: 1. No acute abnormalities. Electronically signed by: Norman Gatlin MD 10/19/2023 07:39 PM EDT RP Workstation: [id number]     Procedures   Medications Ordered in the ED  Tdap (ADACEL) injection 0.5 mL (has no administration in time range)  amoxicillin-clavulanate (AUGMENTIN) 875-125 MG per tablet 1 tablet (has no administration in time range)                                    Medical Decision Making Amount and/or Complexity of Data Reviewed Radiology: ordered.  Risk Prescription drug management.   Chad Paul 33 y.o. presented today for a  laceration to their right distal anterior shin. Working Ddx: FB,  fracture, NV compromise, simple laceration.   Patient presented for a 2 superficial abrasions to their right lower leg. They are neurovascularly intact. Tetanus is up-to-date given here. Patient is in no distress. Laceration will be repaired with standard wound care procedures and antibiotic ointment.   Imaging: X-ray of tibia and fibula of right leg to rule out foreign body or fracture.  Problem List / ED Course / Critical interventions / Medication management  Presents with dog bite to right leg, this is superficial.  Patient is neurovascularly intact.  Well-appearing and laceration is superficial in nature.  Obtaining imaging to rule out foreign body animal control has been contacted already and the patient has contacted [employer]. I ordered medication including oxycodone for pain control Reevaluation of the patient after these medicines showed that the patient improved Patients vitals assessed. Upon arrival patient is hemodynamically stable.  I have reviewed the patients home medicines and have made adjustments as needed. Patient and significant other at bedside.  We had shared decision making about rabies vaccination.  Animal control has been contacted and they feel they would like to wait for further clarification from the dog owner prior to proceeding with rabies vaccination and immunoglobulin for rabies.  They will return if they are unable to get their answer.  Was given Tdap update here.  Also given first dose of Augmentin.  Overall reassuring presentation.  Given return precautions.  Feel stable for discharge with outpatient follow-up.       Final diagnoses:  Dog bite, initial encounter    ED Discharge Orders          Ordered    amoxicillin-clavulanate (AUGMENTIN) 875-125 MG tablet  Every 12 hours        10/19/23 1929               Maris Abascal, Warren SAILOR, PA-C 10/19/23 1946    Armenta Canning, MD 10/19/23 2040
